# Patient Record
Sex: Male | Born: 1945 | Hispanic: No | Smoking: Current every day smoker
Health system: Southern US, Community
[De-identification: ages and names within clinical notes are randomized; demographics above are authoritative.]

## PROBLEM LIST (undated history)

## (undated) DIAGNOSIS — E785 Hyperlipidemia, unspecified: Secondary | ICD-10-CM

## (undated) DIAGNOSIS — E119 Type 2 diabetes mellitus without complications: Secondary | ICD-10-CM

## (undated) DIAGNOSIS — I5022 Chronic systolic (congestive) heart failure: Secondary | ICD-10-CM

## (undated) DIAGNOSIS — I2510 Atherosclerotic heart disease of native coronary artery without angina pectoris: Secondary | ICD-10-CM

## (undated) DIAGNOSIS — N182 Chronic kidney disease, stage 2 (mild): Secondary | ICD-10-CM

## (undated) DIAGNOSIS — I429 Cardiomyopathy, unspecified: Secondary | ICD-10-CM

## (undated) DIAGNOSIS — Z79899 Other long term (current) drug therapy: Secondary | ICD-10-CM

## (undated) HISTORY — DX: Chronic kidney disease, stage 2 (mild): N18.2

## (undated) HISTORY — DX: Hyperlipidemia, unspecified: E78.5

## (undated) HISTORY — DX: Other long term (current) drug therapy: Z79.899

## (undated) HISTORY — DX: Atherosclerotic heart disease of native coronary artery without angina pectoris: I25.10

## (undated) HISTORY — DX: Chronic systolic (congestive) heart failure: I50.22

## (undated) HISTORY — PX: CARDIAC CATHETERIZATION: SHX172

## (undated) HISTORY — DX: Type 2 diabetes mellitus without complications: E11.9

## (undated) HISTORY — DX: Cardiomyopathy, unspecified: I42.9

---

## 1997-11-20 ENCOUNTER — Other Ambulatory Visit: Admission: RE | Admit: 1997-11-20 | Discharge: 1997-11-20 | Payer: Self-pay | Admitting: *Deleted

## 2015-05-06 DIAGNOSIS — I2510 Atherosclerotic heart disease of native coronary artery without angina pectoris: Secondary | ICD-10-CM

## 2015-05-06 DIAGNOSIS — N189 Chronic kidney disease, unspecified: Secondary | ICD-10-CM | POA: Insufficient documentation

## 2015-05-06 DIAGNOSIS — I5042 Chronic combined systolic (congestive) and diastolic (congestive) heart failure: Secondary | ICD-10-CM | POA: Insufficient documentation

## 2015-05-06 DIAGNOSIS — N182 Chronic kidney disease, stage 2 (mild): Secondary | ICD-10-CM | POA: Insufficient documentation

## 2015-05-06 DIAGNOSIS — E785 Hyperlipidemia, unspecified: Secondary | ICD-10-CM

## 2015-05-06 DIAGNOSIS — I429 Cardiomyopathy, unspecified: Secondary | ICD-10-CM

## 2015-05-06 DIAGNOSIS — I5022 Chronic systolic (congestive) heart failure: Secondary | ICD-10-CM

## 2015-05-06 DIAGNOSIS — Z79899 Other long term (current) drug therapy: Secondary | ICD-10-CM

## 2015-05-06 HISTORY — DX: Chronic systolic (congestive) heart failure: I50.22

## 2015-05-06 HISTORY — DX: Cardiomyopathy, unspecified: I42.9

## 2015-05-06 HISTORY — DX: Chronic combined systolic (congestive) and diastolic (congestive) heart failure: I50.42

## 2015-05-06 HISTORY — DX: Atherosclerotic heart disease of native coronary artery without angina pectoris: I25.10

## 2015-05-06 HISTORY — DX: Other long term (current) drug therapy: Z79.899

## 2015-05-06 HISTORY — DX: Chronic kidney disease, unspecified: N18.9

## 2015-05-06 HISTORY — DX: Chronic kidney disease, stage 2 (mild): N18.2

## 2015-05-06 HISTORY — DX: Hyperlipidemia, unspecified: E78.5

## 2017-03-15 ENCOUNTER — Other Ambulatory Visit: Payer: Self-pay | Admitting: *Deleted

## 2017-03-15 ENCOUNTER — Other Ambulatory Visit: Payer: Self-pay

## 2017-03-15 MED ORDER — FUROSEMIDE 20 MG PO TABS: 20.0000 mg | ORAL_TABLET | Freq: Every day | ORAL | 0 refills | Status: DC

## 2017-03-15 MED ORDER — LISINOPRIL 2.5 MG PO TABS: 2.5000 mg | ORAL_TABLET | Freq: Every day | ORAL | 0 refills | Status: DC

## 2017-03-15 MED ORDER — SPIRONOLACTONE 25 MG PO TABS: 25.0000 mg | ORAL_TABLET | Freq: Every day | ORAL | 0 refills | Status: DC

## 2017-03-15 MED ORDER — POTASSIUM CHLORIDE CRYS ER 20 MEQ PO TBCR: 20.0000 meq | EXTENDED_RELEASE_TABLET | Freq: Every day | ORAL | 0 refills | Status: DC

## 2017-03-17 ENCOUNTER — Encounter: Payer: Self-pay | Admitting: *Deleted

## 2017-03-17 ENCOUNTER — Other Ambulatory Visit: Payer: Self-pay | Admitting: *Deleted

## 2017-03-17 DIAGNOSIS — E119 Type 2 diabetes mellitus without complications: Secondary | ICD-10-CM | POA: Insufficient documentation

## 2017-03-17 HISTORY — DX: Type 2 diabetes mellitus without complications: E11.9

## 2017-05-02 DIAGNOSIS — I110 Hypertensive heart disease with heart failure: Secondary | ICD-10-CM | POA: Insufficient documentation

## 2017-05-02 HISTORY — DX: Hypertensive heart disease with heart failure: I11.0

## 2017-05-02 NOTE — Progress Notes (Signed)
Cardiology Office Note:    Date:  05/03/2017   ID:  Frank Swanson, DOB [birth year], MRN [id number]  PCP:  Patient, No Pcp Per  Cardiologist:  Norman Herrlich, MD    Referring MD: No ref. provider found    ASSESSMENT:    1. Chronic combined systolic and diastolic heart failure (HCC)   2. Mild CAD   3. Hyperlipidemia, unspecified hyperlipidemia type   4. Chronic kidney disease, unspecified CKD stage   5. Hypertensive heart disease with heart failure (HCC)    PLAN:    In order of problems listed above:  1. Stable compensated continue current treatment including loop diuretic MRA ACE inhibitor.  Along with beta-blocker.  Previously has had significant reduction in ejection fraction I will recheck an echocardiogram and if EF less than 40 consider transition to ARNI.  I encouraged him to continue to sodium restrict and self manage his heart failure.  Stable continue medical therapy 2. We discussed the merits of a myocardial perfusion study and he declines at this time.  He is taking aspirin 81 mg daily at home 3. Poorly controlled restart a statin 4. Recheck renal function 5. Stable continue current medical treatment diuretic MRA beta-blocker statin.   Next appointment: 6 months   Medication Adjustments/Labs and Tests Ordered: Current medicines are reviewed at length with the patient today.  Concerns regarding medicines are outlined above.  Orders Placed This Encounter  Procedures  . Comprehensive Metabolic Panel (CMET)  . Lipid Profile  . B Nat Peptide  . EKG 12-Lead   Meds ordered this encounter  Medications  . carvedilol (COREG) 6.25 MG tablet    Sig: Take 1 tablet (6.25 mg total) by mouth 2 (two) times daily.    Dispense:  180 tablet    Refill:  3  . furosemide (LASIX) 20 MG tablet    Sig: Take 1 tablet (20 mg total) by mouth daily.    Dispense:  90 tablet    Refill:  3  . lisinopril (PRINIVIL,ZESTRIL) 2.5 MG tablet    Sig: Take 1 tablet (2.5 mg total) by mouth daily.      Dispense:  90 tablet    Refill:  3  . nitroGLYCERIN (NITROSTAT) 0.4 MG SL tablet    Sig: Place 1 tablet (0.4 mg total) under the tongue every 5 (five) minutes as needed for chest pain.    Dispense:  25 tablet    Refill:  11  . potassium chloride SA (K-DUR,KLOR-CON) 20 MEQ tablet    Sig: Take 1 tablet (20 mEq total) by mouth daily.    Dispense:  90 tablet    Refill:  3  . spironolactone (ALDACTONE) 25 MG tablet    Sig: Take 1 tablet (25 mg total) by mouth daily.    Dispense:  90 tablet    Refill:  3  . pravastatin (PRAVACHOL) 20 MG tablet    Sig: Take 1 tablet (20 mg total) by mouth daily.    Dispense:  90 tablet    Refill:  3    Chief Complaint  Patient presents with  . Follow-up  . Coronary Artery Disease    History of Present Illness:    Frank Swanson is a 71 y.o. male with a hx of CAD,CHF with improved EF 40-45% in January 2017, CKD and Dyslipidemia  last seen one year ago.. Compliance with diet, lifestyle and medications: Yes Overall he is pleased with the quality of his life he has had no chest pain nitroglycerin usage  shortness of breath edema palpitations or syncope.  He is compliant with his medications but in some fashion no longer takes a statin. Past Medical History:  Diagnosis Date  . Cardiomyopathy (HCC) 05/06/2015  . 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 Chronic systolic heart failure (HCC) 05/06/2015  . High risk medication use 05/06/2015   Overview:  Digoxin  . Hyperlipidemia 05/06/2015  . Mild CAD 05/06/2015  . Type 2 diabetes mellitus without complication, without long-term current use of insulin (HCC) 03/17/2017    Past Surgical History:  Procedure Laterality Date  . CARDIAC CATHETERIZATION      Current Medications: Current Meds  Medication Sig  . carvedilol (COREG) 6.25 MG tablet Take 1 tablet (6.25 mg total) by mouth 2 (two) times daily.  . furosemide (LASIX) 20 MG tablet Take 1 tablet (20 mg total) by mouth daily.  Marland Kitchen.  lisinopril (PRINIVIL,ZESTRIL) 2.5 MG tablet Take 1 tablet (2.5 mg total) by mouth daily.  . nitroGLYCERIN (NITROSTAT) 0.4 MG SL tablet Place 1 tablet (0.4 mg total) under the tongue every 5 (five) minutes as needed for chest pain.  Marland Kitchen. omeprazole (PRILOSEC) 20 MG capsule Take 20 mg by mouth daily.  . potassium chloride SA (K-DUR,KLOR-CON) 20 MEQ tablet Take 1 tablet (20 mEq total) by mouth daily.  Marland Kitchen. spironolactone (ALDACTONE) 25 MG tablet Take 1 tablet (25 mg total) by mouth daily.  . [DISCONTINUED] carvedilol (COREG) 6.25 MG tablet Take 6.25 mg by mouth 2 (two) times daily.  . [DISCONTINUED] furosemide (LASIX) 20 MG tablet Take 1 tablet (20 mg total) by mouth daily.  . [DISCONTINUED] lisinopril (PRINIVIL,ZESTRIL) 2.5 MG tablet Take 1 tablet (2.5 mg total) by mouth daily.  . [DISCONTINUED] nitroGLYCERIN (NITROSTAT) 0.4 MG SL tablet Place 0.4 mg under the tongue.  . [DISCONTINUED] potassium chloride SA (K-DUR,KLOR-CON) 20 MEQ tablet Take 1 tablet (20 mEq total) by mouth daily.  . [DISCONTINUED] spironolactone (ALDACTONE) 25 MG tablet Take 1 tablet (25 mg total) by mouth daily.     Allergies:   Patient has no known allergies.   Social History   Socioeconomic History  . Marital status: Unknown    Spouse name: None  . Number of children: None  . Years of education: None  . Highest education level: None  Social Needs  . Financial resource strain: None  . Food insecurity - worry: None  . Food insecurity - inability: None  . Transportation needs - medical: None  . Transportation needs - non-medical: None  Occupational History  . None  Tobacco Use  . Smoking status: Current Every Day Smoker  . Smokeless tobacco: Never Used  Substance and Sexual Activity  . Alcohol use: No  . Drug use: No  . Sexual activity: None  Other Topics Concern  . None  Social History Narrative  . None     Family History: The patient's family history is not on file. ROS:   Please see the history of present  illness.    All other systems reviewed and are negative.  EKGs/Labs/Other Studies Reviewed:    The following studies were reviewed today:  EKG:  EKG ordered today.  The ekg ordered today demonstrates sinus rhythm normal  Recent Labs: No results found for requested labs within last 8760 hours.  Recent Lipid Panel No results found for: CHOL, TRIG, HDL, CHOLHDL, VLDL, LDLCALC, LDLDIRECT  Physical Exam:    VS:  BP 110/60 (BP Location: Right Arm, Patient Position: Sitting, Cuff Size: Normal)   Pulse 88  Ht 5\' 9"  (1.753 m)   Wt 189 lb (85.7 kg)   SpO2 98%   BMI 27.91 kg/m     Wt Readings from Last 3 Encounters:  05/03/17 189 lb (85.7 kg)     GEN:  Well nourished, well developed in no acute distress HEENT: Normal NECK: No JVD; No carotid bruits LYMPHATICS: No lymphadenopathy CARDIAC: RRR, no murmurs, rubs, gallops RESPIRATORY:  Clear to auscultation without rales, wheezing or rhonchi  ABDOMEN: Soft, non-tender, non-distended MUSCULOSKELETAL:  No edema; No deformity  SKIN: Warm and dry NEUROLOGIC:  Alert and oriented x 3 PSYCHIATRIC:  Normal affect    Signed, Norman HerrlichBrian Madden Garron, MD  05/03/2017 12:34 PM    [facility]

## 2017-05-03 ENCOUNTER — Encounter: Payer: Self-pay | Admitting: Cardiology

## 2017-05-03 ENCOUNTER — Ambulatory Visit (INDEPENDENT_AMBULATORY_CARE_PROVIDER_SITE_OTHER): Payer: Medicare Other | Admitting: Cardiology

## 2017-05-03 VITALS — BP 110/60 | HR 88 | Ht 69.0 in | Wt 189.0 lb

## 2017-05-03 DIAGNOSIS — E785 Hyperlipidemia, unspecified: Secondary | ICD-10-CM

## 2017-05-03 DIAGNOSIS — I2510 Atherosclerotic heart disease of native coronary artery without angina pectoris: Secondary | ICD-10-CM

## 2017-05-03 DIAGNOSIS — N189 Chronic kidney disease, unspecified: Secondary | ICD-10-CM

## 2017-05-03 DIAGNOSIS — I110 Hypertensive heart disease with heart failure: Secondary | ICD-10-CM

## 2017-05-03 DIAGNOSIS — I5042 Chronic combined systolic (congestive) and diastolic (congestive) heart failure: Secondary | ICD-10-CM

## 2017-05-03 MED ORDER — SPIRONOLACTONE 25 MG PO TABS: 25.0000 mg | ORAL_TABLET | Freq: Every day | ORAL | 3 refills | Status: DC

## 2017-05-03 MED ORDER — POTASSIUM CHLORIDE CRYS ER 20 MEQ PO TBCR: 20.0000 meq | EXTENDED_RELEASE_TABLET | Freq: Every day | ORAL | 3 refills | Status: DC

## 2017-05-03 MED ORDER — PRAVASTATIN SODIUM 20 MG PO TABS: 20.0000 mg | ORAL_TABLET | Freq: Every day | ORAL | 3 refills | Status: DC

## 2017-05-03 MED ORDER — NITROGLYCERIN 0.4 MG SL SUBL: 0.4000 mg | SUBLINGUAL_TABLET | SUBLINGUAL | 11 refills | Status: DC | PRN

## 2017-05-03 MED ORDER — CARVEDILOL 6.25 MG PO TABS: 6.2500 mg | ORAL_TABLET | Freq: Two times a day (BID) | ORAL | 3 refills | Status: DC

## 2017-05-03 MED ORDER — LISINOPRIL 2.5 MG PO TABS: 2.5000 mg | ORAL_TABLET | Freq: Every day | ORAL | 3 refills | Status: DC

## 2017-05-03 MED ORDER — FUROSEMIDE 20 MG PO TABS: 20.0000 mg | ORAL_TABLET | Freq: Every day | ORAL | 3 refills | Status: DC

## 2017-05-03 NOTE — Patient Instructions (Addendum)
Medication Instructions:  Your physician has recommended you make the following change in your medication:  RESTART pravastatin  Labwork: Your physician recommends that you return for lab work in: today. CMP, BNP, lipid  Testing/Procedures: You had an EKG today.  Follow-Up: Your physician wants you to follow-up in: 7 months. You will receive a reminder letter in the mail two months in advance. If you don't receive a letter, please call our office to schedule the follow-up appointment.  Any Other Special Instructions Will Be Listed Below (If Applicable).     If you need a refill on your cardiac medications before your next appointment, please call your pharmacy.    Heart Failure  Weigh yourself every morning when you first wake up and record on a calender or note pad, bring this to your office visits. Using a pill tender can help with taking your medications consistently.  Limit your fluid intake to 2 liters daily  Limit your sodium intake to less than 2-3 grams daily. Ask if you need dietary teaching.  If you gain more than 3 pounds (from your dry weight ), double your dose of diuretic for the day.  If you gain more than 5 pounds (from your dry weight), double your dose of lasix and call your heart failure doctor.  Please do not smoke tobacco since it is very bad for your heart.  Please do not drink alcohol since it can worsen your heart failure.Also avoid OTC nonsteroidal drugs, such as advil, aleve and motrin.  Try to exercise for at least 30 minutes every day because this will help your heart be more efficient. You may be eligible for supervised cardiac rehab, ask your physician.

## 2017-05-04 LAB — COMPREHENSIVE METABOLIC PANEL
A/G RATIO: 1.8 (ref 1.2–2.2)
ALK PHOS: 71 IU/L (ref 39–117)
ALT: 14 IU/L (ref 0–44)
AST: 17 IU/L (ref 0–40)
Albumin: 4.5 g/dL (ref 3.5–4.8)
BUN/Creatinine Ratio: 12 (ref 10–24)
BUN: 14 mg/dL (ref 8–27)
Bilirubin Total: 0.6 mg/dL (ref 0.0–1.2)
CALCIUM: 9.1 mg/dL (ref 8.6–10.2)
CHLORIDE: 101 mmol/L (ref 96–106)
CO2: 25 mmol/L (ref 20–29)
Creatinine, Ser: 1.16 mg/dL (ref 0.76–1.27)
GFR calc Af Amer: 72 mL/min/{1.73_m2} (ref >59)
GFR, EST NON AFRICAN AMERICAN: 62 mL/min/{1.73_m2} (ref >59)
Globulin, Total: 2.5 g/dL (ref 1.5–4.5)
Glucose: 101 mg/dL — ABNORMAL HIGH (ref 65–99)
POTASSIUM: 4.3 mmol/L (ref 3.5–5.2)
Sodium: 139 mmol/L (ref 134–144)
Total Protein: 7 g/dL (ref 6.0–8.5)

## 2017-05-04 LAB — LIPID PANEL
CHOL/HDL RATIO: 6.6 ratio — AB (ref 0.0–5.0)
CHOLESTEROL TOTAL: 225 mg/dL — AB (ref 100–199)
HDL: 34 mg/dL — AB (ref >39)
LDL Calculated: 127 mg/dL — ABNORMAL HIGH (ref 0–99)
TRIGLYCERIDES: 320 mg/dL — AB (ref 0–149)
VLDL Cholesterol Cal: 64 mg/dL — ABNORMAL HIGH (ref 5–40)

## 2017-05-04 LAB — BRAIN NATRIURETIC PEPTIDE: BNP: 42 pg/mL (ref 0.0–100.0)

## 2017-09-19 ENCOUNTER — Other Ambulatory Visit: Payer: Self-pay

## 2017-09-19 MED ORDER — CARVEDILOL 6.25 MG PO TABS: 6.2500 mg | ORAL_TABLET | Freq: Two times a day (BID) | ORAL | 1 refills | Status: DC

## 2018-06-12 ENCOUNTER — Other Ambulatory Visit: Payer: Self-pay | Admitting: Cardiology

## 2018-06-12 NOTE — Telephone Encounter (Signed)
Patient needs a refill  Nitorglycerin 0.4mg  and he is scheduled for app tin February with B Munley.  Please call to the [facility] In [place]

## 2018-06-13 MED ORDER — NITROGLYCERIN 0.4 MG SL SUBL: 0.4000 mg | SUBLINGUAL_TABLET | SUBLINGUAL | 0 refills | Status: DC | PRN

## 2018-06-13 NOTE — Telephone Encounter (Signed)
30 day supply of Nitro sent to pharmacy as requested. Refills will not be authorized until patient is seen for F/U on 07/05/2018.

## 2018-07-04 NOTE — Progress Notes (Signed)
Cardiology Office Note:    Date:  07/05/2018   ID:  Frank Swanson, DOB 02-16-1946, MRN [id number]  PCP:  Patient, No Pcp Per  Cardiologist:  Norman Herrlich, MD    Referring MD: No ref. provider found    ASSESSMENT:    1. Mild CAD   2. Chronic combined systolic and diastolic heart failure (HCC)   3. Hypertensive heart disease with heart failure (HCC)   4. Chronic kidney disease, unspecified CKD stage   5. Hyperlipidemia, unspecified hyperlipidemia type    PLAN:    In order of problems listed above:  1. Stable CAD continue medical treatment of a has recurrent angina he did need either cardiac CTA or coronary angiography. 2. Stable compensated no fluid overload continue current treatment including his loop diuretic beta-blocker ACE and distal diuretic 3. Stable continue current treatment with cardiomyopathy 4. Recheck renal function 5. Continue his low intensity statin he is poorly tolerant to the other products   Next appointment: 1 year   Medication Adjustments/Labs and Tests Ordered: Current medicines are reviewed at length with the patient today.  Concerns regarding medicines are outlined above.  No orders of the defined types were placed in this encounter.  No orders of the defined types were placed in this encounter.   Chief Complaint  Patient presents with  . Follow-up    for   . Coronary Artery Disease  . Congestive Heart Failure  . Diabetes Mellitus  . Chronic Kidney Disease    History of Present Illness:    Frank Swanson is a 73 y.o. male with a hx of CAD,CHF with improved EF 40-45% in January 2017, CKD and Dyslipidemia  Intolerant of high intensity statin last seen by me 05/03/17. Compliance with diet, lifestyle and medications: Yes  He is pleased with the quality of his life he has had no symptoms of shortness of breath edema chest pain palpitation or syncope we discussed the potential of doing an ischemic evaluation or echocardiogram for ejection fraction  and he feels at this time he prefers not to do it.  We will recheck his labs as he does not have primary care at this time. Past Medical History:  Diagnosis Date  . Cardiomyopathy (HCC) 05/06/2015  . 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 Chronic systolic heart failure (HCC) 05/06/2015  . High risk medication use 05/06/2015   Overview:  Digoxin  . Hyperlipidemia 05/06/2015  . Mild CAD 05/06/2015  . Type 2 diabetes mellitus without complication, without long-term current use of insulin (HCC) 03/17/2017    Past Surgical History:  Procedure Laterality Date  . CARDIAC CATHETERIZATION      Current Medications: Current Meds  Medication Sig  . carvedilol (COREG) 6.25 MG tablet Take 1 tablet (6.25 mg total) by mouth 2 (two) times daily.  . furosemide (LASIX) 20 MG tablet Take 1 tablet (20 mg total) by mouth daily.  Marland Kitchen lisinopril (PRINIVIL,ZESTRIL) 2.5 MG tablet Take 1 tablet (2.5 mg total) by mouth daily.  . nitroGLYCERIN (NITROSTAT) 0.4 MG SL tablet Place 1 tablet (0.4 mg total) under the tongue every 5 (five) minutes as needed for chest pain.  Marland Kitchen omeprazole (PRILOSEC) 20 MG capsule Take 20 mg by mouth daily.  . potassium chloride SA (K-DUR,KLOR-CON) 20 MEQ tablet Take 1 tablet (20 mEq total) by mouth daily.  . pravastatin (PRAVACHOL) 20 MG tablet Take 1 tablet (20 mg total) by mouth daily.  Marland Kitchen spironolactone (ALDACTONE) 25 MG tablet Take 1 tablet (25 mg total)  by mouth daily.     Allergies:   Patient has no known allergies.   Social History   Socioeconomic History  . Marital status: Unknown    Spouse name: Not on file  . Number of children: Not on file  . Years of education: Not on file  . Highest education level: Not on file  Occupational History  . Not on file  Social Needs  . Financial resource strain: Not on file  . Food insecurity:    Worry: Not on file    Inability: Not on file  . Transportation needs:    Medical: Not on file    Non-medical: Not on file    Tobacco Use  . Smoking status: Current Every Day Smoker    Types: Cigarettes  . Smokeless tobacco: Never Used  Substance and Sexual Activity  . Alcohol use: No  . Drug use: No  . Sexual activity: Not on file  Lifestyle  . Physical activity:    Days per week: Not on file    Minutes per session: Not on file  . Stress: Not on file  Relationships  . Social connections:    Talks on phone: Not on file    Gets together: Not on file    Attends religious service: Not on file    Active member of club or organization: Not on file    Attends meetings of clubs or organizations: Not on file    Relationship status: Not on file  Other Topics Concern  . Not on file  Social History Narrative  . Not on file     Family History: The patient's family history includes Cancer in his father; Heart failure in his mother. ROS:   Please see the history of present illness.    All other systems reviewed and are negative.  EKGs/Labs/Other Studies Reviewed:    The following studies were reviewed today:  EKG:  EKG ordered today.  The ekg ordered today demonstrates shows sinus rhythm nonspecific ST-T abnormality 2 PVCs  Recent Labs: No results found for requested labs within last 8760 hours.  Recent Lipid Panel    Component Value Date/Time   CHOL 225 (H) 05/03/2017 1006   TRIG 320 (H) 05/03/2017 1006   HDL 34 (L) 05/03/2017 1006   CHOLHDL 6.6 (H) 05/03/2017 1006   LDLCALC 127 (H) 05/03/2017 1006    Physical Exam:    VS:  BP 100/68 (BP Location: Right Arm, Patient Position: Sitting, Cuff Size: Normal)   Pulse 79   Ht 5\' 9"  (1.753 m)   Wt 188 lb 3.2 oz (85.4 kg)   SpO2 96%   BMI 27.79 kg/m     Wt Readings from Last 3 Encounters:  07/05/18 188 lb 3.2 oz (85.4 kg)  05/03/17 189 lb (85.7 kg)     GEN:  Well nourished, well developed in no acute distress HEENT: Normal NECK: No JVD; No carotid bruits LYMPHATICS: No lymphadenopathy CARDIAC: RRR, no murmurs, rubs, gallops RESPIRATORY:   Clear to auscultation without rales, wheezing or rhonchi  ABDOMEN: Soft, non-tender, non-distended MUSCULOSKELETAL:  No edema; No deformity  SKIN: Warm and dry NEUROLOGIC:  Alert and oriented x 3 PSYCHIATRIC:  Normal affect    Signed, Norman Herrlich, MD  07/05/2018 9:05 AM    [facility]

## 2018-07-05 ENCOUNTER — Ambulatory Visit (INDEPENDENT_AMBULATORY_CARE_PROVIDER_SITE_OTHER): Payer: Medicare Other | Admitting: Cardiology

## 2018-07-05 ENCOUNTER — Encounter: Payer: Self-pay | Admitting: Cardiology

## 2018-07-05 VITALS — BP 100/68 | HR 79 | Ht 69.0 in | Wt 188.2 lb

## 2018-07-05 DIAGNOSIS — I2510 Atherosclerotic heart disease of native coronary artery without angina pectoris: Secondary | ICD-10-CM | POA: Diagnosis not present

## 2018-07-05 DIAGNOSIS — I5042 Chronic combined systolic (congestive) and diastolic (congestive) heart failure: Secondary | ICD-10-CM | POA: Diagnosis not present

## 2018-07-05 DIAGNOSIS — E785 Hyperlipidemia, unspecified: Secondary | ICD-10-CM

## 2018-07-05 DIAGNOSIS — N189 Chronic kidney disease, unspecified: Secondary | ICD-10-CM | POA: Diagnosis not present

## 2018-07-05 DIAGNOSIS — I110 Hypertensive heart disease with heart failure: Secondary | ICD-10-CM | POA: Diagnosis not present

## 2018-07-05 DIAGNOSIS — Z1329 Encounter for screening for other suspected endocrine disorder: Secondary | ICD-10-CM

## 2018-07-05 LAB — COMPREHENSIVE METABOLIC PANEL
ALT: 15 IU/L (ref 0–44)
AST: 19 IU/L (ref 0–40)
Albumin/Globulin Ratio: 2 (ref 1.2–2.2)
Albumin: 4.5 g/dL (ref 3.7–4.7)
Alkaline Phosphatase: 68 IU/L (ref 39–117)
BILIRUBIN TOTAL: 0.6 mg/dL (ref 0.0–1.2)
BUN / CREAT RATIO: 15 (ref 10–24)
BUN: 18 mg/dL (ref 8–27)
CALCIUM: 9.2 mg/dL (ref 8.6–10.2)
CO2: 22 mmol/L (ref 20–29)
Chloride: 101 mmol/L (ref 96–106)
Creatinine, Ser: 1.22 mg/dL (ref 0.76–1.27)
GFR, EST AFRICAN AMERICAN: 67 mL/min/{1.73_m2} (ref >59)
GFR, EST NON AFRICAN AMERICAN: 58 mL/min/{1.73_m2} — AB (ref >59)
GLOBULIN, TOTAL: 2.2 g/dL (ref 1.5–4.5)
Glucose: 120 mg/dL — ABNORMAL HIGH (ref 65–99)
POTASSIUM: 4.1 mmol/L (ref 3.5–5.2)
SODIUM: 140 mmol/L (ref 134–144)
TOTAL PROTEIN: 6.7 g/dL (ref 6.0–8.5)

## 2018-07-05 LAB — TSH: TSH: 0.714 u[IU]/mL (ref 0.450–4.500)

## 2018-07-05 LAB — PRO B NATRIURETIC PEPTIDE: NT-Pro BNP: 1637 pg/mL — ABNORMAL HIGH (ref 0–376)

## 2018-07-05 LAB — LIPID PANEL
Chol/HDL Ratio: 5.2 ratio — ABNORMAL HIGH (ref 0.0–5.0)
Cholesterol, Total: 196 mg/dL (ref 100–199)
HDL: 38 mg/dL — ABNORMAL LOW (ref >39)
LDL CALC: 107 mg/dL — AB (ref 0–99)
Triglycerides: 257 mg/dL — ABNORMAL HIGH (ref 0–149)
VLDL CHOLESTEROL CAL: 51 mg/dL — AB (ref 5–40)

## 2018-07-05 MED ORDER — NITROGLYCERIN 0.4 MG SL SUBL: 0.4000 mg | SUBLINGUAL_TABLET | SUBLINGUAL | 11 refills | Status: DC | PRN

## 2018-07-05 MED ORDER — ASPIRIN EC 81 MG PO TBEC: 81.0000 mg | DELAYED_RELEASE_TABLET | Freq: Every day | ORAL | 3 refills | Status: AC

## 2018-07-05 NOTE — Patient Instructions (Addendum)
Medication Instructions:  Your physician recommends that you continue on your current medications as directed. Please refer to the Current Medication list given to you today.RN asked patient if he was currently taking a 81 mg aspirin, if not Dr. Dulce Sellar request he start a 81mg  Aspirin daily.  If you need a refill on your cardiac medications before your next appointment, please call your pharmacy.   Lab work: Your physician recommends that you have a lipid panel, CMP, TSH and ProBNP today.   If you have labs (blood work) drawn today and your tests are completely normal, you will receive your results only by: Marland Kitchen MyChart Message (if you have MyChart) OR . A paper copy in the mail If you have any lab test that is abnormal or we need to change your treatment, we will call you to review the results.  Testing/Procedures: An EKG was performed today  Follow-Up: At [facility], you and your health needs are our priority.  As part of our continuing mission to provide you with exceptional heart care, we have created designated Provider Care Teams.  These Care Teams include your primary Cardiologist (physician) and Advanced Practice Providers (APPs -  Physician Assistants and Nurse Practitioners) who all work together to provide you with the care you need, when you need it. You will need a follow up appointment in 1 years.  Please call our office 2 months in advance to schedule this appointment.

## 2018-09-23 ENCOUNTER — Other Ambulatory Visit: Payer: Self-pay | Admitting: Cardiology

## 2018-09-24 NOTE — Telephone Encounter (Signed)
Rx refill sent to pharmacy. 

## 2019-02-03 ENCOUNTER — Other Ambulatory Visit: Payer: Self-pay | Admitting: Cardiology

## 2019-02-04 ENCOUNTER — Other Ambulatory Visit: Payer: Self-pay

## 2019-02-04 MED ORDER — CARVEDILOL 6.25 MG PO TABS: 6.2500 mg | ORAL_TABLET | Freq: Two times a day (BID) | ORAL | 1 refills | Status: DC

## 2019-04-17 ENCOUNTER — Other Ambulatory Visit: Payer: Self-pay | Admitting: Cardiology

## 2019-05-26 ENCOUNTER — Other Ambulatory Visit: Payer: Self-pay | Admitting: Cardiology

## 2019-06-10 ENCOUNTER — Other Ambulatory Visit: Payer: Self-pay | Admitting: Cardiology

## 2019-07-04 ENCOUNTER — Telehealth: Payer: Self-pay | Admitting: Cardiology

## 2019-07-04 MED ORDER — NITROGLYCERIN 0.4 MG SL SUBL: SUBLINGUAL_TABLET | SUBLINGUAL | 0 refills | Status: DC

## 2019-07-04 NOTE — Addendum Note (Signed)
Addended by: Theola Sequin on: 07/04/2019 04:33 PM   Modules accepted: Orders

## 2019-07-04 NOTE — Telephone Encounter (Signed)
Call nitrostat to [facility] in [place]

## 2019-07-04 NOTE — Telephone Encounter (Signed)
RX for #[id number] sent to [facility] pharmacy on file per patient request. He has an appointment scheduled for next month with provider.

## 2019-07-05 ENCOUNTER — Ambulatory Visit: Payer: Medicare Other | Admitting: Cardiology

## 2019-07-22 ENCOUNTER — Other Ambulatory Visit: Payer: Self-pay | Admitting: Cardiology

## 2019-08-06 NOTE — Progress Notes (Signed)
Cardiology Office Note:    Date:  08/07/2019   ID:  Frank Swanson, DOB 08-30-1945, MRN [id number]  PCP:  Patient, No Pcp Per  Cardiologist:  Shirlee More, MD    Referring MD: No ref. provider found    ASSESSMENT:    1. Mild CAD   2. Chronic combined systolic and diastolic heart failure ([place])   3. Hypertensive heart disease with heart failure ([place])   4. Chronic kidney disease, unspecified CKD stage   5. Hyperlipidemia, unspecified hyperlipidemia type    PLAN:    In order of problems listed above:  1. CAD is stable he has a pattern of exertional angina New York Heart Association class II stable declines additional antianginal therapy or evaluation for revascularization continue his current treatment aspirin beta-blocker and a statin.  If he is having more frequent unstable angina direct referral to coronary angiography is appropriate 2. His heart failure is compensated New York Heart Association class I has no edema continue his treatment including small dose of loop diuretic distal diuretic and beta-blocker.  He also takes a small dose of lisinopril.  If he had a great an echocardiogram and if EF is less than 40 I will transition him to contrast. 3. BP stable at target continue guideline directed therapy 4. Recheck renal function potassium with spironolactone 5. Continue with statin he did not tolerate higher intensity statins.  His LDL is greater than 100 I will advise him to take Zetia along with pravastatin   Next appointment: 6 months in order to watch his anginal pattern closely   Medication Adjustments/Labs and Tests Ordered: Current medicines are reviewed at length with the patient today.  Concerns regarding medicines are outlined above.  No orders of the defined types were placed in this encounter.  No orders of the defined types were placed in this encounter.   Chief Complaint  Patient presents with  . Follow-up  . Congestive Heart Failure  . Coronary Artery Disease     History of Present Illness:    Frank Swanson is a 74 y.o. male with a hx of CAD,CHF with improved EF 40-45% in January 2017, CKD and Dyslipidemia  Intolerant of high intensity statin last seen 07/05/2018. Compliance with diet, lifestyle and medications: Yes Follows heart failure is compensated with proBNP level remains elevated.   Ref Range & Units 1 yr ago  NT-Pro BNP 0 - 376 pg/mL 1,637   Avion is pleased with the quality of his life couple times a week he has exertional chest pain relieved with rest and nitroglycerin stable pattern we talked about additional antianginal therapy or ischemia evaluation looking for revascularization and he declines at this time.  He has good healthcare literacy and said he contact me if he has change in his pattern.  He tolerates lipid-lowering therapy without muscle pain or weakness.  He is not having edema shortness of breath orthopnea chest pain palpitation or syncope.  He declines an echocardiogram for ejection fraction.  He prefers medical therapy and will contact me again if there is a change in CAD or heart failure status Past Medical History:  Diagnosis Date  . Cardiomyopathy ([place]) 05/06/2015  . Chronic combined systolic and diastolic heart failure ([place]) 05/06/2015  . 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 Chronic systolic heart failure ([place]) 05/06/2015  . CKD (chronic kidney disease) 05/06/2015  . High risk medication use 05/06/2015   Overview:  Digoxin  . Hyperlipidemia 05/06/2015  . Hypertensive heart disease with heart  failure (HCC) 05/02/2017  . Mild CAD 05/06/2015  . Type 2 diabetes mellitus without complication, without long-term current use of insulin (HCC) 03/17/2017    Past Surgical History:  Procedure Laterality Date  . CARDIAC CATHETERIZATION      Current Medications: Current Meds  Medication Sig  . aspirin EC 81 MG tablet Take 1 tablet (81 mg total) by mouth daily.  . carvedilol (COREG) 6.25 MG tablet Take 1  tablet (6.25 mg total) by mouth 2 (two) times daily.  . furosemide (LASIX) 20 MG tablet Take 1 tablet by mouth once daily  . lisinopril (ZESTRIL) 2.5 MG tablet Take 1 tablet by mouth once daily  . nitroGLYCERIN (NITROSTAT) 0.4 MG SL tablet DISSOLVE ONE TABLET UNDER THE TONGUE EVERY 5 MINUTES AS NEEDED FOR CHEST PAIN.  DO NOT EXCEED A TOTAL OF 3 DOSES IN 15 MINUTES  . omeprazole (PRILOSEC) 20 MG capsule Take 20 mg by mouth daily.  . potassium chloride SA (K-DUR) 20 MEQ tablet Take 1 tablet by mouth once daily  . pravastatin (PRAVACHOL) 20 MG tablet Take 1 tablet by mouth once daily  . spironolactone (ALDACTONE) 25 MG tablet Take 1 tablet by mouth once daily     Allergies:   Patient has no known allergies.   Social History   Socioeconomic History  . Marital status: Unknown    Spouse name: Not on file  . Number of children: Not on file  . Years of education: Not on file  . Highest education level: Not on file  Occupational History  . Not on file  Tobacco Use  . Smoking status: Current Every Day Smoker    Types: Cigarettes  . Smokeless tobacco: Never Used  Substance and Sexual Activity  . Alcohol use: No  . Drug use: No  . Sexual activity: Not on file  Other Topics Concern  . Not on file  Social History Narrative  . Not on file   Social Determinants of Health   Financial Resource Strain:   . Difficulty of Paying Living Expenses:   Food Insecurity:   . Worried About Programme researcher, broadcasting/film/video in the Last Year:   . Barista in the Last Year:   Transportation Needs:   . Freight forwarder (Medical):   Marland Kitchen Lack of Transportation (Non-Medical):   Physical Activity:   . Days of Exercise per Week:   . Minutes of Exercise per Session:   Stress:   . Feeling of Stress :   Social Connections:   . Frequency of Communication with Friends and Family:   . Frequency of Social Gatherings with Friends and Family:   . Attends Religious Services:   . Active Member of Clubs or  Organizations:   . Attends Banker Meetings:   Marland Kitchen Marital Status:      Family History: The patient's family history includes Cancer in his father; Heart failure in his mother. ROS:   Please see the history of present illness.    All other systems reviewed and are negative.  EKGs/Labs/Other Studies Reviewed:    The following studies were reviewed today:  EKG:  EKG ordered today and personally reviewed.  The ekg ordered today demonstrates rhythm he has repolarization changes diffusely versus ischemia.  His EKG is improved from a year ago he has no PVCs  Recent Labs: No lab work since his last visit Recent Lipid Panel    Component Value Date/Time   CHOL 196 07/05/2018 0918   TRIG 257 (  H) 07/05/2018 0918   HDL 38 (L) 07/05/2018 0918   CHOLHDL 5.2 (H) 07/05/2018 0918   LDLCALC 107 (H) 07/05/2018 2585    Physical Exam:    VS:  BP 136/74   Pulse 74   Temp (!) 97.5 F (36.4 C)   Ht 5\' 9"  (1.753 m)   Wt 180 lb 9.6 oz (81.9 kg)   SpO2 95%   BMI 26.67 kg/m     Wt Readings from Last 3 Encounters:  08/07/19 180 lb 9.6 oz (81.9 kg)  07/05/18 188 lb 3.2 oz (85.4 kg)  05/03/17 189 lb (85.7 kg)     GEN:  Well nourished, well developed in no acute distress HEENT: Normal NECK: No JVD; No carotid bruits LYMPHATICS: No lymphadenopathy CARDIAC: RRR, no murmurs, rubs, gallops RESPIRATORY:  Clear to auscultation without rales, wheezing or rhonchi  ABDOMEN: Soft, non-tender, non-distended MUSCULOSKELETAL:  No edema; No deformity  SKIN: Warm and dry NEUROLOGIC:  Alert and oriented x 3 PSYCHIATRIC:  Normal affect    Signed, 05/05/17, MD  08/07/2019 3:58 PM    [facility]

## 2019-08-07 ENCOUNTER — Ambulatory Visit: Payer: Medicare Other | Admitting: Cardiology

## 2019-08-07 ENCOUNTER — Other Ambulatory Visit: Payer: Self-pay

## 2019-08-07 ENCOUNTER — Encounter (INDEPENDENT_AMBULATORY_CARE_PROVIDER_SITE_OTHER): Payer: Self-pay

## 2019-08-07 VITALS — BP 136/74 | HR 74 | Temp 97.5°F | Ht 69.0 in | Wt 180.6 lb

## 2019-08-07 DIAGNOSIS — E785 Hyperlipidemia, unspecified: Secondary | ICD-10-CM

## 2019-08-07 DIAGNOSIS — I5042 Chronic combined systolic (congestive) and diastolic (congestive) heart failure: Secondary | ICD-10-CM

## 2019-08-07 DIAGNOSIS — I2510 Atherosclerotic heart disease of native coronary artery without angina pectoris: Secondary | ICD-10-CM | POA: Diagnosis not present

## 2019-08-07 DIAGNOSIS — N189 Chronic kidney disease, unspecified: Secondary | ICD-10-CM

## 2019-08-07 DIAGNOSIS — I110 Hypertensive heart disease with heart failure: Secondary | ICD-10-CM | POA: Diagnosis not present

## 2019-08-07 NOTE — Patient Instructions (Signed)
Medication Instructions:  Your physician recommends that you continue on your current medications as directed. Please refer to the Current Medication list given to you today.  *If you need a refill on your cardiac medications before your next appointment, please call your pharmacy*   Lab Work: Cmp, Lipids & Pro Bnp   If you have labs (blood work) drawn today and your tests are completely normal, you will receive your results only by: Marland Kitchen MyChart Message (if you have MyChart) OR . A paper copy in the mail If you have any lab test that is abnormal or we need to change your treatment, we will call you to review the results.   Testing/Procedures: None ordered    Follow-Up: At [facility], you and your health needs are our priority.  As part of our continuing mission to provide you with exceptional heart care, we have created designated Provider Care Teams.  These Care Teams include your primary Cardiologist (physician) and Advanced Practice Providers (APPs -  Physician Assistants and Nurse Practitioners) who all work together to provide you with the care you need, when you need it.  We recommend signing up for the patient portal called "MyChart".  Sign up information is provided on this After Visit Summary.  MyChart is used to connect with patients for Virtual Visits (Telemedicine).  Patients are able to view lab/test results, encounter notes, upcoming appointments, etc.  Non-urgent messages can be sent to your provider as well.   To learn more about what you can do with MyChart, go to [email or web address].    Your next appointment:   6 month(s)  The format for your next appointment:   In Person  Provider:   Norman Herrlich, MD   Other Instructions None

## 2019-08-08 LAB — LIPID PANEL
Chol/HDL Ratio: 6 ratio — ABNORMAL HIGH (ref 0.0–5.0)
Cholesterol, Total: 209 mg/dL — ABNORMAL HIGH (ref 100–199)
HDL: 35 mg/dL — ABNORMAL LOW (ref >39)
LDL Chol Calc (NIH): 118 mg/dL — ABNORMAL HIGH (ref 0–99)
Triglycerides: 319 mg/dL — ABNORMAL HIGH (ref 0–149)
VLDL Cholesterol Cal: 56 mg/dL — ABNORMAL HIGH (ref 5–40)

## 2019-08-08 LAB — COMPREHENSIVE METABOLIC PANEL
ALT: 15 IU/L (ref 0–44)
AST: 19 IU/L (ref 0–40)
Albumin/Globulin Ratio: 1.8 (ref 1.2–2.2)
Albumin: 4.3 g/dL (ref 3.7–4.7)
Alkaline Phosphatase: 74 IU/L (ref 39–117)
BUN/Creatinine Ratio: 16 (ref 10–24)
BUN: 31 mg/dL — ABNORMAL HIGH (ref 8–27)
Bilirubin Total: 0.3 mg/dL (ref 0.0–1.2)
CO2: 21 mmol/L (ref 20–29)
Calcium: 9.2 mg/dL (ref 8.6–10.2)
Chloride: 103 mmol/L (ref 96–106)
Creatinine, Ser: 1.93 mg/dL — ABNORMAL HIGH (ref 0.76–1.27)
GFR calc Af Amer: 38 mL/min/{1.73_m2} — ABNORMAL LOW (ref >59)
GFR calc non Af Amer: 33 mL/min/{1.73_m2} — ABNORMAL LOW (ref >59)
Globulin, Total: 2.4 g/dL (ref 1.5–4.5)
Glucose: 97 mg/dL (ref 65–99)
Potassium: 5 mmol/L (ref 3.5–5.2)
Sodium: 139 mmol/L (ref 134–144)
Total Protein: 6.7 g/dL (ref 6.0–8.5)

## 2019-08-08 LAB — PRO B NATRIURETIC PEPTIDE: NT-Pro BNP: 3013 pg/mL — ABNORMAL HIGH (ref 0–486)

## 2019-08-24 ENCOUNTER — Other Ambulatory Visit: Payer: Self-pay | Admitting: Cardiology

## 2019-10-01 ENCOUNTER — Telehealth: Payer: Self-pay | Admitting: Cardiology

## 2019-10-01 DIAGNOSIS — I5042 Chronic combined systolic (congestive) and diastolic (congestive) heart failure: Secondary | ICD-10-CM

## 2019-10-01 DIAGNOSIS — I429 Cardiomyopathy, unspecified: Secondary | ICD-10-CM

## 2019-10-01 NOTE — Telephone Encounter (Signed)
States he never got his lab results from 03/24

## 2019-10-01 NOTE — Telephone Encounter (Signed)
Spoke to the patient just now and let him know these recommendations from Dr. Dulce Sellar. He verbalizes understanding and does not have any other issues or concerns at this time.   Encouraged patient to call back with any questions or concerns.

## 2019-10-01 NOTE — Telephone Encounter (Signed)
Patient called in stating that he never heard anything in regards to his lab work from 08/07/19. I do not see that it was reviewed. Please review and I will call him back with the results once you have.

## 2019-10-01 NOTE — Addendum Note (Signed)
Addended by: Delorse Limber I on: 10/01/2019 01:36 PM   Modules accepted: Orders

## 2019-10-01 NOTE — Telephone Encounter (Signed)
Thank You for Calling and Not Sure of Ever Seeing These Labs.  His kidney function is worsened compared to baseline  I would like him to stop spironolactone wait 2 weeks and recheck a BMP.

## 2019-10-25 ENCOUNTER — Encounter: Payer: Self-pay | Admitting: Sports Medicine

## 2019-10-25 ENCOUNTER — Other Ambulatory Visit: Payer: Self-pay

## 2019-10-25 ENCOUNTER — Ambulatory Visit: Payer: Medicare Other | Admitting: Sports Medicine

## 2019-10-25 ENCOUNTER — Ambulatory Visit (INDEPENDENT_AMBULATORY_CARE_PROVIDER_SITE_OTHER): Payer: Medicare Other

## 2019-10-25 DIAGNOSIS — M79672 Pain in left foot: Secondary | ICD-10-CM | POA: Diagnosis not present

## 2019-10-25 DIAGNOSIS — M1000 Idiopathic gout, unspecified site: Secondary | ICD-10-CM

## 2019-10-25 DIAGNOSIS — M779 Enthesopathy, unspecified: Secondary | ICD-10-CM

## 2019-10-25 DIAGNOSIS — M7752 Other enthesopathy of left foot: Secondary | ICD-10-CM | POA: Diagnosis not present

## 2019-10-25 DIAGNOSIS — I739 Peripheral vascular disease, unspecified: Secondary | ICD-10-CM

## 2019-10-25 DIAGNOSIS — M10072 Idiopathic gout, left ankle and foot: Secondary | ICD-10-CM

## 2019-10-25 MED ORDER — TRIAMCINOLONE ACETONIDE 10 MG/ML IJ SUSP
10.0000 mg | Freq: Once | INTRAMUSCULAR | Status: AC
  Administered 2019-10-25: 10 mg

## 2019-10-25 NOTE — Patient Instructions (Signed)

## 2019-10-25 NOTE — Progress Notes (Signed)
Subjective: Frank Swanson is a 74 y.o. male patient who presents to office for evaluation of left foot pain. Patient complains of pain at toe joint second toe joint for the last 10 days sharp constant pain relief with elevation worse with walking and standing reports that it has gotten red swollen color change to the area has tried rest patient of Aspercreme without relief. Patient denies any other pedal complaints.   Review of Systems  All other systems reviewed and are negative.    Patient Active Problem List   Diagnosis Date Noted  . Hypertensive heart disease with heart failure (HCC) 05/02/2017  . Type 2 diabetes mellitus without complication, without long-term current use of insulin (HCC) 03/17/2017  . Cardiomyopathy (HCC) 05/06/2015  . CKD (chronic kidney disease) 05/06/2015  . Chronic combined systolic and diastolic heart failure (HCC) 05/06/2015  . High risk medication use 05/06/2015  . Hyperlipidemia 05/06/2015  . Mild CAD 05/06/2015    Current Outpatient Medications on File Prior to Visit  Medication Sig Dispense Refill  . aspirin EC 81 MG tablet Take 1 tablet (81 mg total) by mouth daily. 90 tablet 3  . carvedilol (COREG) 6.25 MG tablet Take 1 tablet (6.25 mg total) by mouth 2 (two) times daily. 180 tablet 1  . furosemide (LASIX) 20 MG tablet Take 1 tablet by mouth once daily 90 tablet 3  . lisinopril (ZESTRIL) 2.5 MG tablet Take 1 tablet by mouth once daily 90 tablet 3  . nitroGLYCERIN (NITROSTAT) 0.4 MG SL tablet DISSOLVE ONE TABLET UNDER THE TONGUE EVERY 5 MINUTES AS NEEDED FOR CHEST PAIN.  DO NOT EXCEED A TOTAL OF 3 DOSES IN 15 MINUTES 25 tablet 3  . omeprazole (PRILOSEC) 20 MG capsule Take 20 mg by mouth daily.    . potassium chloride SA (K-DUR) 20 MEQ tablet Take 1 tablet by mouth once daily 90 tablet 3  . pravastatin (PRAVACHOL) 20 MG tablet Take 1 tablet by mouth once daily 90 tablet 1  . spironolactone (ALDACTONE) 25 MG tablet Take 1 tablet by mouth once daily 90  tablet 3   No current facility-administered medications on file prior to visit.    No Known Allergies  Objective:  General: Alert and oriented x3 in no acute distress  Dermatology: Focal Swelling, warmth, redness present on the left foot at 1st mTPJ>2nd, No open lesions bilateral lower extremities, no webspace macerations, no ecchymosis bilateral, all nails x 10 are well manicured.  Vascular: Dorsalis Pedis and Posterior Tibial pedal pulses 1/4, Capillary Fill Time 5 seconds,(-) pedal hair growth bilateral,Temperature gradient increased over the left first metatarsophalangeal joint mildly.  Neurology: Michaell Cowing sensation intact via light touch bilateral.  Musculoskeletal: There is tenderness with palpation at left first metatarsophalangeal joint,No pain with calf compression bilateral. All joint range of motion is within normal limits except at the left first greater than second metatarsophalangeal joint where there is pain and limiation, Strength within normal limits in all groups bilateral.   Gait: Unassisted, Antalgic gait avoiding weight on left foot.  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       Assessment and Plan: Problem List Items Addressed This Visit    None    Visit Diagnoses    Left foot pain    -  Primary   Relevant Medications   triamcinolone acetonide (KENALOG) 10 MG/ML injection 10 mg (Completed) (Start on  10/25/2019 12:15 PM)   Other Relevant Orders   DG Foot Complete Left   Capsulitis       Relevant Medications   triamcinolone acetonide (KENALOG) 10 MG/ML injection 10 mg (Completed) (Start on 10/25/2019 12:15 PM)   Acute idiopathic gout, unspecified site       Relevant Medications   triamcinolone acetonide (KENALOG) 10 MG/ML injection 10 mg (Completed) (Start on 10/25/2019 12:15 PM)   PVD (peripheral vascular disease) ([place])           -Complete examination performed -Xrays reviewed -Discussed treatement options for capsulitis versus gouty arthritis and gout education provided - After oral consent, injected left first metatarsophalangeal joint with 1cc lidocaine and marcaine plain mixed with 0.25cc Kenalog-10 and Dexmethasone phosphate without complication; patient had a mild bit of pain during the injection but was able to tolerate it.  Post injection care explained. -Advised Tylenol as needed for any postinjection pain -Advised icing and elevating to minimize any postinjection pain -Dispensed Surgitube compression sleeve to wear during the day to assist with edema control -Patient to return in 2-3 weeks for re-check/further discussion for long term management of gout or sooner if condition worsens.  Landis Martins, DPM

## 2019-10-31 ENCOUNTER — Other Ambulatory Visit: Payer: Self-pay | Admitting: Sports Medicine

## 2019-10-31 ENCOUNTER — Other Ambulatory Visit: Payer: Self-pay | Admitting: Cardiology

## 2019-10-31 DIAGNOSIS — M779 Enthesopathy, unspecified: Secondary | ICD-10-CM

## 2019-11-15 ENCOUNTER — Ambulatory Visit: Payer: Medicare Other | Admitting: Sports Medicine

## 2019-11-15 ENCOUNTER — Other Ambulatory Visit: Payer: Self-pay

## 2019-11-15 ENCOUNTER — Encounter: Payer: Self-pay | Admitting: Sports Medicine

## 2019-11-15 DIAGNOSIS — M1000 Idiopathic gout, unspecified site: Secondary | ICD-10-CM

## 2019-11-15 DIAGNOSIS — M79672 Pain in left foot: Secondary | ICD-10-CM | POA: Diagnosis not present

## 2019-11-15 DIAGNOSIS — M779 Enthesopathy, unspecified: Secondary | ICD-10-CM

## 2019-11-15 DIAGNOSIS — I739 Peripheral vascular disease, unspecified: Secondary | ICD-10-CM | POA: Diagnosis not present

## 2019-11-15 NOTE — Progress Notes (Signed)
Subjective: Frank Swanson is a 74 y.o. male patient returns office for follow-up evaluation of left big toe joint pain.  Patient reports after injection pain is doing better a little sore underneath 4 out of 10 but a lot better than previous denies any redness warmth swelling drainage or any other constitutional symptoms at this time.  Patient does admit that few days ago his left second toe got red but then it went away but no pain to this area.  Patient denies any other pedal complaints at this time.   Patient Active Problem List   Diagnosis Date Noted  . Hypertensive heart disease with heart failure (HCC) 05/02/2017  . Type 2 diabetes mellitus without complication, without long-term current use of insulin (HCC) 03/17/2017  . Cardiomyopathy (HCC) 05/06/2015  . CKD (chronic kidney disease) 05/06/2015  . Chronic combined systolic and diastolic heart failure (HCC) 05/06/2015  . High risk medication use 05/06/2015  . Hyperlipidemia 05/06/2015  . Mild CAD 05/06/2015    Current Outpatient Medications on File Prior to Visit  Medication Sig Dispense Refill  . aspirin EC 81 MG tablet Take 1 tablet (81 mg total) by mouth daily. 90 tablet 3  . carvedilol (COREG) 6.25 MG tablet Take 1 tablet (6.25 mg total) by mouth 2 (two) times daily. 180 tablet 1  . furosemide (LASIX) 20 MG tablet Take 1 tablet by mouth once daily 90 tablet 3  . lisinopril (ZESTRIL) 2.5 MG tablet Take 1 tablet by mouth once daily 90 tablet 3  . nitroGLYCERIN (NITROSTAT) 0.4 MG SL tablet DISSOLVE ONE TABLET UNDER THE TONGUE EVERY 5 MINUTES AS NEEDED FOR CHEST PAIN.  DO NOT EXCEED A TOTAL OF 3 DOSES IN 15 MINUTES 25 tablet 6  . omeprazole (PRILOSEC) 20 MG capsule Take 20 mg by mouth daily.    . potassium chloride SA (K-DUR) 20 MEQ tablet Take 1 tablet by mouth once daily 90 tablet 3  . pravastatin (PRAVACHOL) 20 MG tablet Take 1 tablet by mouth once daily 90 tablet 1  . spironolactone (ALDACTONE) 25 MG tablet Take 1 tablet by mouth  once daily 90 tablet 3   No current facility-administered medications on file prior to visit.    No Known Allergies  Objective:  General: Alert and oriented x3 in no acute distress  Dermatology: No swelling no redness warmth noted to the left first or second metatarsophalangeal joints, No open lesions bilateral lower extremities, no webspace macerations, no ecchymosis bilateral, all nails x 10 are well manicured.  Vascular: Dorsalis Pedis and Posterior Tibial pedal pulses 1/4, Capillary Fill Time 5 seconds,(-) pedal hair growth bilateral,Temperature gradient increased over the left first metatarsophalangeal joint mildly.  Neurology: Gross sensation intact via light touch bilateral.  Musculoskeletal: Decreased tenderness palpation to left first metatarsophalangeal joint, mid pad atrophy noted bilateral left greater than right, Strength within normal limits in all groups bilateral.       Assessment and Plan: Problem List Items Addressed This Visit    None    Visit Diagnoses    Acute idiopathic gout, unspecified site    -  Primary   Capsulitis       PVD (peripheral vascular disease) (HCC)       Left foot pain          -Complete examination performed -Discussed with patient resolving capsulitis -Applied metatarsal padding to left shoe -Advised patient to continue with topical pain cream, epsom soaking, rest, ice, elevation, and Tylenol as needed -Patient to return as needed or  sooner if condition worsens.  Landis Martins, DPM

## 2019-11-18 ENCOUNTER — Other Ambulatory Visit: Payer: Self-pay | Admitting: Cardiology

## 2020-01-01 ENCOUNTER — Other Ambulatory Visit: Payer: Self-pay | Admitting: Cardiology

## 2020-02-26 ENCOUNTER — Other Ambulatory Visit: Payer: Self-pay | Admitting: Cardiology

## 2020-04-12 NOTE — Progress Notes (Addendum)
Cardiology Office Note:    Date:  04/13/2020   ID:  Frank Swanson, DOB 1946/04/18, MRN [id number]  PCP:  Patient, No Pcp Per  Cardiologist:  Norman Herrlich, MD    Referring MD: No ref. provider found    ASSESSMENT:    1. Mild CAD   2. Hypertensive heart disease with heart failure (HCC)   3. Chronic systolic heart failure (HCC)    PLAN:    In order of problems listed above:  1. He has a stable pattern of exertional angina New York Heart Association class II continue treatment aspirin beta-blocker as needed nitroglycerin and lipid-lowering with statin.  He declines an ischemia evaluation at this time I offered him oral nitrates and he declined 2. Stable BP at target continue treatment ACE inhibitor diuretic beta-blocker and MRA. 3. Stable compensated no edema tenuous low-dose diuretic along with guideline directed therapy beta-blocker ACE inhibitor MRA and check renal function with his CKD   Next appointment: Months   Medication Adjustments/Labs and Tests Ordered: Current medicines are reviewed at length with the patient today.  Concerns regarding medicines are outlined above.  Orders Placed This Encounter  Procedures  . Comprehensive metabolic panel  . Lipid panel   No orders of the defined types were placed in this encounter.   Chief Complaint  Patient presents with  . Follow-up  . Coronary Artery Disease  . Congestive Heart Failure    History of Present Illness:    Frank Swanson is a 74 y.o. male with a hx of mild CAD heart failure mid range ejection fraction 40 to 45% chronic kidney disease and dyslipidemia being intolerant of high intensity statin last seen 08/07/2019.  Last visit we discussed echocardiogram to optimize medical therapy he declined is maintained on his loop diuretic beta-blocker ACE inhibitor and spironolactone.. Compliance with diet, lifestyle and medications: Yes  He has a stable pattern of exertional angina when he does garden work either relieved  with rest or nitroglycerin it takes 2 to 23-month.  He does not have rest episodes does not occur nocturnally has not changed in frequency.  We discussed an ischemia evaluation he declined I offered him additional antianginal therapy with oral nitrates he declines and is pleased with the quality of his life.  He is having no edema orthopnea palpitation or syncope. Past Medical History:  Diagnosis Date  . Cardiomyopathy (HCC) 05/06/2015  . Chronic combined systolic and diastolic heart failure (HCC) 05/06/2015  . 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 Chronic systolic heart failure (HCC) 05/06/2015  . CKD (chronic kidney disease) 05/06/2015  . High risk medication use 05/06/2015   Overview:  Digoxin  . Hyperlipidemia 05/06/2015  . Hypertensive heart disease with heart failure (HCC) 05/02/2017  . Mild CAD 05/06/2015  . Type 2 diabetes mellitus without complication, without long-term current use of insulin (HCC) 03/17/2017    Past Surgical History:  Procedure Laterality Date  . CARDIAC CATHETERIZATION      Current Medications: Current Meds  Medication Sig  . aspirin EC 81 MG tablet Take 1 tablet (81 mg total) by mouth daily.  . carvedilol (COREG) 6.25 MG tablet Take 1 tablet by mouth twice daily  . furosemide (LASIX) 20 MG tablet Take 1 tablet by mouth once daily  . glimepiride (AMARYL) 2 MG tablet Take 2 mg by mouth daily with breakfast.  . lisinopril (ZESTRIL) 2.5 MG tablet Take 1 tablet by mouth once daily  . nitroGLYCERIN (NITROSTAT) 0.4 MG SL tablet DISSOLVE ONE  TABLET UNDER THE TONGUE EVERY 5 MINUTES AS NEEDED FOR CHEST PAIN.  DO NOT EXCEED A TOTAL OF 3 DOSES IN 15 MINUTES  . omeprazole (PRILOSEC) 20 MG capsule Take 20 mg by mouth daily.  . potassium chloride SA (KLOR-CON) 20 MEQ tablet Take 1 tablet by mouth once daily  . pravastatin (PRAVACHOL) 20 MG tablet Take 1 tablet by mouth once daily  . spironolactone (ALDACTONE) 25 MG tablet Take 1 tablet by mouth once daily       Allergies:   Patient has no known allergies.   Social History   Socioeconomic History  . Marital status: Unknown    Spouse name: Not on file  . Number of children: Not on file  . Years of education: Not on file  . Highest education level: Not on file  Occupational History  . Not on file  Tobacco Use  . Smoking status: Current Every Day Smoker    Types: Cigarettes  . Smokeless tobacco: Never Used  Vaping Use  . Vaping Use: Never used  Substance and Sexual Activity  . Alcohol use: No  . Drug use: No  . Sexual activity: Not on file  Other Topics Concern  . Not on file  Social History Narrative  . Not on file   Social Determinants of Health   Financial Resource Strain:   . Difficulty of Paying Living Expenses: Not on file  Food Insecurity:   . Worried About Programme researcher, broadcasting/film/video in the Last Year: Not on file  . Ran Out of Food in the Last Year: Not on file  Transportation Needs:   . Lack of Transportation (Medical): Not on file  . Lack of Transportation (Non-Medical): Not on file  Physical Activity:   . Days of Exercise per Week: Not on file  . Minutes of Exercise per Session: Not on file  Stress:   . Feeling of Stress : Not on file  Social Connections:   . Frequency of Communication with Friends and Family: Not on file  . Frequency of Social Gatherings with Friends and Family: Not on file  . Attends Religious Services: Not on file  . Active Member of Clubs or Organizations: Not on file  . Attends Banker Meetings: Not on file  . Marital Status: Not on file     Family History: The patient's family history includes Cancer in his father; Heart failure in his mother. ROS:   Please see the history of present illness.    All other systems reviewed and are negative.  EKGs/Labs/Other Studies Reviewed:    The following studies were reviewed today:    Recent Labs: 08/07/2019: ALT 15; BUN 31; Creatinine, Ser 1.93; NT-Pro BNP 3,013; Potassium 5.0;  Sodium 139  Recent Lipid Panel    Component Value Date/Time   CHOL 209 (H) 08/07/2019 1626   TRIG 319 (H) 08/07/2019 1626   HDL 35 (L) 08/07/2019 1626   CHOLHDL 6.0 (H) 08/07/2019 1626   LDLCALC 118 (H) 08/07/2019 1626    Physical Exam:    VS:  BP 135/71   Pulse 70   Ht 5\' 9"  (1.753 m)   Wt 177 lb 9.6 oz (80.6 kg)   SpO2 96%   BMI 26.23 kg/m     Wt Readings from Last 3 Encounters:  04/13/20 177 lb 9.6 oz (80.6 kg)  08/07/19 180 lb 9.6 oz (81.9 kg)  07/05/18 188 lb 3.2 oz (85.4 kg)     GEN:  Well nourished, well developed  in no acute distress HEENT: Normal NECK: No JVD; No carotid bruits LYMPHATICS: No lymphadenopathy CARDIAC: RRR, no murmurs, rubs, gallops RESPIRATORY:  Clear to auscultation without rales, wheezing or rhonchi  ABDOMEN: Soft, non-tender, non-distended MUSCULOSKELETAL:  No edema; No deformity  SKIN: Warm and dry NEUROLOGIC:  Alert and oriented x 3 PSYCHIATRIC:  Normal affect    Signed, Norman Herrlich, MD  04/13/2020 9:50 AM    [facility]

## 2020-04-13 ENCOUNTER — Ambulatory Visit: Payer: Medicare Other | Admitting: Cardiology

## 2020-04-13 ENCOUNTER — Encounter: Payer: Self-pay | Admitting: Cardiology

## 2020-04-13 ENCOUNTER — Other Ambulatory Visit: Payer: Self-pay

## 2020-04-13 VITALS — BP 135/71 | HR 70 | Ht 69.0 in | Wt 177.6 lb

## 2020-04-13 DIAGNOSIS — I110 Hypertensive heart disease with heart failure: Secondary | ICD-10-CM | POA: Diagnosis not present

## 2020-04-13 DIAGNOSIS — I5022 Chronic systolic (congestive) heart failure: Secondary | ICD-10-CM | POA: Diagnosis not present

## 2020-04-13 DIAGNOSIS — I2510 Atherosclerotic heart disease of native coronary artery without angina pectoris: Secondary | ICD-10-CM

## 2020-04-13 LAB — COMPREHENSIVE METABOLIC PANEL
ALT: 15 IU/L (ref 0–44)
AST: 18 IU/L (ref 0–40)
Albumin/Globulin Ratio: 1.8 (ref 1.2–2.2)
Albumin: 4.3 g/dL (ref 3.7–4.7)
Alkaline Phosphatase: 75 IU/L (ref 44–121)
BUN/Creatinine Ratio: 16 (ref 10–24)
BUN: 22 mg/dL (ref 8–27)
Bilirubin Total: 0.4 mg/dL (ref 0.0–1.2)
CO2: 24 mmol/L (ref 20–29)
Calcium: 9.4 mg/dL (ref 8.6–10.2)
Chloride: 103 mmol/L (ref 96–106)
Creatinine, Ser: 1.37 mg/dL — ABNORMAL HIGH (ref 0.76–1.27)
GFR calc Af Amer: 58 mL/min/{1.73_m2} — ABNORMAL LOW (ref >59)
GFR calc non Af Amer: 50 mL/min/{1.73_m2} — ABNORMAL LOW (ref >59)
Globulin, Total: 2.4 g/dL (ref 1.5–4.5)
Glucose: 97 mg/dL (ref 65–99)
Potassium: 4.2 mmol/L (ref 3.5–5.2)
Sodium: 139 mmol/L (ref 134–144)
Total Protein: 6.7 g/dL (ref 6.0–8.5)

## 2020-04-13 LAB — LIPID PANEL
Chol/HDL Ratio: 4.3 ratio (ref 0.0–5.0)
Cholesterol, Total: 175 mg/dL (ref 100–199)
HDL: 41 mg/dL (ref >39)
LDL Chol Calc (NIH): 102 mg/dL — ABNORMAL HIGH (ref 0–99)
Triglycerides: 184 mg/dL — ABNORMAL HIGH (ref 0–149)
VLDL Cholesterol Cal: 32 mg/dL (ref 5–40)

## 2020-04-13 NOTE — Patient Instructions (Signed)

## 2020-04-14 ENCOUNTER — Telehealth: Payer: Self-pay

## 2020-04-14 NOTE — Telephone Encounter (Signed)
Per Dr. Munley:  Good result no changes in treatment    Spoke with patient regarding results and recommendation.  Patient verbalizes understanding and is agreeable to plan of care. Advised patient to call back with any issues or concerns.   

## 2020-05-14 ENCOUNTER — Other Ambulatory Visit: Payer: Self-pay | Admitting: Cardiology

## 2020-05-14 NOTE — Telephone Encounter (Signed)
Refill sent to pharmacy.   Potassium chloride SA 20 MEQ

## 2020-06-01 ENCOUNTER — Other Ambulatory Visit: Payer: Self-pay | Admitting: Cardiology

## 2020-06-01 NOTE — Telephone Encounter (Signed)
Refill sent to pharmacy.   

## 2020-06-10 ENCOUNTER — Other Ambulatory Visit: Payer: Self-pay | Admitting: Cardiology

## 2020-06-10 NOTE — Telephone Encounter (Signed)
Rx refill sent to pharmacy. 

## 2020-07-27 ENCOUNTER — Other Ambulatory Visit: Payer: Self-pay | Admitting: Cardiology

## 2020-07-27 NOTE — Telephone Encounter (Signed)
Nitroglycerin approved and sent  

## 2020-08-07 ENCOUNTER — Other Ambulatory Visit: Payer: Self-pay | Admitting: Cardiology

## 2020-08-07 NOTE — Telephone Encounter (Signed)
Potassium CL approved and sent 

## 2020-09-01 ENCOUNTER — Other Ambulatory Visit: Payer: Self-pay | Admitting: Cardiology

## 2020-09-07 ENCOUNTER — Other Ambulatory Visit: Payer: Self-pay | Admitting: Cardiology

## 2020-09-22 ENCOUNTER — Other Ambulatory Visit: Payer: Self-pay | Admitting: Cardiology

## 2021-03-30 ENCOUNTER — Other Ambulatory Visit: Payer: Self-pay

## 2021-03-30 ENCOUNTER — Encounter: Payer: Self-pay | Admitting: Cardiology

## 2021-03-30 ENCOUNTER — Ambulatory Visit: Payer: Medicare Other | Admitting: Cardiology

## 2021-03-30 VITALS — BP 110/60 | HR 88 | Ht 69.0 in | Wt 175.8 lb

## 2021-03-30 DIAGNOSIS — I110 Hypertensive heart disease with heart failure: Secondary | ICD-10-CM | POA: Diagnosis not present

## 2021-03-30 DIAGNOSIS — N189 Chronic kidney disease, unspecified: Secondary | ICD-10-CM | POA: Diagnosis not present

## 2021-03-30 DIAGNOSIS — I5042 Chronic combined systolic (congestive) and diastolic (congestive) heart failure: Secondary | ICD-10-CM | POA: Diagnosis not present

## 2021-03-30 DIAGNOSIS — E119 Type 2 diabetes mellitus without complications: Secondary | ICD-10-CM

## 2021-03-30 DIAGNOSIS — I2510 Atherosclerotic heart disease of native coronary artery without angina pectoris: Secondary | ICD-10-CM | POA: Diagnosis not present

## 2021-03-30 DIAGNOSIS — E782 Mixed hyperlipidemia: Secondary | ICD-10-CM

## 2021-03-30 NOTE — Progress Notes (Signed)
Cardiology Office Note:    Date:  03/30/2021   ID:  Frank Swanson, DOB 08-27-1945, MRN [id number]  PCP:  Patient, No Pcp Per (Inactive)  Cardiologist:  Norman Herrlich, MD   Referring MD: No ref. provider found  ASSESSMENT:    1. Mild CAD   2. Hypertensive heart disease with heart failure (HCC)   3. Chronic combined systolic and diastolic heart failure (HCC)   4. Chronic kidney disease, unspecified CKD stage   5. Mixed hyperlipidemia   6. Type 2 diabetes mellitus without complication, without long-term current use of insulin (HCC)    PLAN:    In order of problems listed above:  Overall Mr. [name] is doing well not having typical anginal pattern is unchanged he does not want to undergo an ischemic evaluation at this time continue continue treatment including aspirin statin nitroglycerin and beta-blocker.  He is assured me if his symptoms change worsen or become more typical and contact me and I told him at that time I have located coronary angiography and revascularization if appropriate. Stable BP at target continue treatment including beta-blocker ACE diuretic recheck renal function potassium Recheck renal function continue current treatment including MRA Continue with statin he is intolerant of high intensity statins and recheck LDL lipid profile liver function He has had no medical interaction last year we will check a hemoglobin A1c please on Amaryl  Next appointment 1 year   Medication Adjustments/Labs and Tests Ordered: Current medicines are reviewed at length with the patient today.  Concerns regarding medicines are outlined above.  No orders of the defined types were placed in this encounter.  No orders of the defined types were placed in this encounter.     Chief complaint: Follow-up for CAD  History of Present Illness:    Frank Swanson is a 75 y.o. male who is being seen today for mild CAD heart failure ejection fraction 40 to 45% chronic kidney disease and  dyslipidemia being intolerant of high intensity statin last seen 04/13/2020 and yearly follow-up.  At that time he had a stable pattern of exertional angina and declined an ischemic evaluation as heart failure was compensated.  I would best describe Frank Swanson is unchanged occasionally once or twice a month he has chest discomfort with and without activities at times relieved with belching and at times nitroglycerin with relief the pattern has not changed and is not exertional chest pain.  He has had no edema shortness of breath palpitation or syncope and has had no medical interactions since his last visit with me.  He tolerates his low intensity statin previously intolerant of atorvastatin. Past Medical History:  Diagnosis Date   Cardiomyopathy (HCC) 05/06/2015   Chronic combined systolic and diastolic heart failure (HCC) 05/06/2015   Chronic kidney disease, stage II (mild) 05/06/2015   Chronic systolic heart failure (HCC) 05/06/2015   CKD (chronic kidney disease) 05/06/2015   High risk medication use 05/06/2015   Overview:  Digoxin   Hyperlipidemia 05/06/2015   Hypertensive heart disease with heart failure (HCC) 05/02/2017   Mild CAD 05/06/2015   Type 2 diabetes mellitus without complication, without long-term current use of insulin (HCC) 03/17/2017    Past Surgical History:  Procedure Laterality Date   CARDIAC CATHETERIZATION      Current Medications: Current Meds  Medication Sig   aspirin EC 81 MG tablet Take 1 tablet (81 mg total) by mouth daily.   carvedilol (COREG) 6.25 MG tablet Take 1 tablet by mouth twice daily  furosemide (LASIX) 20 MG tablet Take 1 tablet by mouth once daily   glimepiride (AMARYL) 2 MG tablet Take 2 mg by mouth daily with breakfast.   lisinopril (ZESTRIL) 2.5 MG tablet Take 1 tablet by mouth once daily   nitroGLYCERIN (NITROSTAT) 0.4 MG SL tablet DISSOLVE ONE TABLET UNDER THE TONGUE EVERY 5 MINUTES AS NEEDED FOR CHEST PAIN.  DO NOT EXCEED A TOTAL OF 3 DOSES  IN 15 MINUTES   omeprazole (PRILOSEC) 20 MG capsule Take 20 mg by mouth daily.   potassium chloride SA (KLOR-CON) 20 MEQ tablet TAKE 1  BY MOUTH ONCE DAILY   pravastatin (PRAVACHOL) 20 MG tablet Take 1 tablet by mouth once daily   spironolactone (ALDACTONE) 25 MG tablet Take 1 tablet by mouth once daily     Allergies:   Patient has no known allergies.   Social History   Socioeconomic History   Marital status: Unknown    Spouse name: Not on file   Number of children: Not on file   Years of education: Not on file   Highest education level: Not on file  Occupational History   Not on file  Tobacco Use   Smoking status: Every Day    Types: Cigarettes   Smokeless tobacco: Never  Vaping Use   Vaping Use: Never used  Substance and Sexual Activity   Alcohol use: No   Drug use: No   Sexual activity: Not on file  Other Topics Concern   Not on file  Social History Narrative   Not on file   Social Determinants of Health   Financial Resource Strain: Not on file  Food Insecurity: Not on file  Transportation Needs: Not on file  Physical Activity: Not on file  Stress: Not on file  Social Connections: Not on file     Family History: The patient's  family history includes Cancer in his father; Heart failure in his mother.  ROS:   ROS Please see the history of present illness.      All other systems reviewed and are negative.  EKGs/Labs/Other Studies Reviewed:    The following studies were reviewed today:    EKG:  EKG is   ordered today.  The ekg ordered today is personally reviewed and demonstrates sinus rhythm with occasional PVC  Recent Labs: 04/13/2020: ALT 15; BUN 22; Creatinine, Ser 1.37; Potassium 4.2; Sodium 139  Recent Lipid Panel    Component Value Date/Time   CHOL 175 04/13/2020 0958   TRIG 184 (H) 04/13/2020 0958   HDL 41 04/13/2020 0958   CHOLHDL 4.3 04/13/2020 0958   LDLCALC 102 (H) 04/13/2020 0958    Physical Exam:    VS:  BP 110/60   Pulse 88    Ht 5\' 9"  (1.753 m)   Wt 175 lb 12.8 oz (79.7 kg)   SpO2 97%   BMI 25.96 kg/m     Wt Readings from Last 3 Encounters:  03/30/21 175 lb 12.8 oz (79.7 kg)  04/13/20 177 lb 9.6 oz (80.6 kg)  08/07/19 180 lb 9.6 oz (81.9 kg)     GEN: He appears his age well nourished, well developed in no acute distress HEENT: Normal NECK: No JVD; No carotid bruits LYMPHATICS: No lymphadenopathy CARDIAC: RRR, no murmurs, rubs, gallops RESPIRATORY:  Clear to auscultation without rales, wheezing or rhonchi  ABDOMEN: Soft, non-tender, non-distended MUSCULOSKELETAL:  No edema; No deformity  SKIN: Warm and dry NEUROLOGIC:  Alert and oriented x 3 PSYCHIATRIC:  Normal affect  Signed, Shirlee More, MD  03/30/2021 10:07 AM    [place]

## 2021-03-30 NOTE — Patient Instructions (Signed)
Medication Instructions:  Your physician recommends that you continue on your current medications as directed. Please refer to the Current Medication list given to you today.  *If you need a refill on your cardiac medications before your next appointment, please call your pharmacy*   Lab Work: Your physician recommends that you return for lab work in: TODAY HGB A1C, CMP, Lipids If you have labs (blood work) drawn today and your tests are completely normal, you will receive your results only by: MyChart Message (if you have MyChart) OR A paper copy in the mail If you have any lab test that is abnormal or we need to change your treatment, we will call you to review the results.   Testing/Procedures: None   Follow-Up: At [facility], you and your health needs are our priority.  As part of our continuing mission to provide you with exceptional heart care, we have created designated Provider Care Teams.  These Care Teams include your primary Cardiologist (physician) and Advanced Practice Providers (APPs -  Physician Assistants and Nurse Practitioners) who all work together to provide you with the care you need, when you need it.  We recommend signing up for the patient portal called "MyChart".  Sign up information is provided on this After Visit Summary.  MyChart is used to connect with patients for Virtual Visits (Telemedicine).  Patients are able to view lab/test results, encounter notes, upcoming appointments, etc.  Non-urgent messages can be sent to your provider as well.   To learn more about what you can do with MyChart, go to [email or web address].    Your next appointment:   1 year(s)  The format for your next appointment:   In Person  Provider:   Norman Herrlich, MD    Other Instructions

## 2021-03-31 LAB — COMPREHENSIVE METABOLIC PANEL
ALT: 12 IU/L (ref 0–44)
AST: 19 IU/L (ref 0–40)
Albumin/Globulin Ratio: 1.8 (ref 1.2–2.2)
Albumin: 4.3 g/dL (ref 3.7–4.7)
Alkaline Phosphatase: 78 IU/L (ref 44–121)
BUN/Creatinine Ratio: 15 (ref 10–24)
BUN: 19 mg/dL (ref 8–27)
Bilirubin Total: 0.5 mg/dL (ref 0.0–1.2)
CO2: 22 mmol/L (ref 20–29)
Calcium: 9.1 mg/dL (ref 8.6–10.2)
Chloride: 98 mmol/L (ref 96–106)
Creatinine, Ser: 1.28 mg/dL — ABNORMAL HIGH (ref 0.76–1.27)
Globulin, Total: 2.4 g/dL (ref 1.5–4.5)
Glucose: 118 mg/dL — ABNORMAL HIGH (ref 70–99)
Potassium: 4.2 mmol/L (ref 3.5–5.2)
Sodium: 134 mmol/L (ref 134–144)
Total Protein: 6.7 g/dL (ref 6.0–8.5)
eGFR: 58 mL/min/{1.73_m2} — ABNORMAL LOW (ref >59)

## 2021-03-31 LAB — HEMOGLOBIN A1C
Est. average glucose Bld gHb Est-mCnc: 114 mg/dL
Hgb A1c MFr Bld: 5.6 % (ref 4.8–5.6)

## 2021-03-31 LAB — LIPID PANEL
Chol/HDL Ratio: 3.8 ratio (ref 0.0–5.0)
Cholesterol, Total: 156 mg/dL (ref 100–199)
HDL: 41 mg/dL (ref >39)
LDL Chol Calc (NIH): 81 mg/dL (ref 0–99)
Triglycerides: 200 mg/dL — ABNORMAL HIGH (ref 0–149)
VLDL Cholesterol Cal: 34 mg/dL (ref 5–40)

## 2021-04-19 ENCOUNTER — Telehealth: Payer: Self-pay | Admitting: Cardiology

## 2021-04-19 NOTE — Telephone Encounter (Signed)
Patient came by office stating he had labs drawn on 03/30/21 and has not heard any results. Patient requested a phone call with results and results mailed to him.

## 2021-04-19 NOTE — Telephone Encounter (Signed)
Spoke with patient regarding results and recommendation.  Patient verbalizes understanding and is agreeable to plan of care. Advised patient to call back with any issues or concerns.  

## 2021-04-23 ENCOUNTER — Other Ambulatory Visit: Payer: Self-pay

## 2021-04-23 MED ORDER — PRAVASTATIN SODIUM 20 MG PO TABS: 20.0000 mg | ORAL_TABLET | Freq: Every day | ORAL | 2 refills | Status: DC

## 2021-04-23 NOTE — Telephone Encounter (Signed)
Refill of Pravastatin 20 mg sent to [facility].

## 2021-04-26 ENCOUNTER — Other Ambulatory Visit: Payer: Self-pay | Admitting: Cardiology

## 2021-06-28 ENCOUNTER — Other Ambulatory Visit: Payer: Self-pay | Admitting: Cardiology

## 2021-07-21 ENCOUNTER — Other Ambulatory Visit: Payer: Self-pay | Admitting: Cardiology

## 2021-10-12 ENCOUNTER — Telehealth: Payer: Self-pay | Admitting: Cardiology

## 2021-10-12 DIAGNOSIS — R079 Chest pain, unspecified: Secondary | ICD-10-CM

## 2021-10-12 NOTE — Addendum Note (Signed)
Addended by: Eleonore Chiquito on: 10/12/2021 02:23 PM   Modules accepted: Orders

## 2021-10-12 NOTE — Addendum Note (Signed)
Addended by: Norman Herrlich on: 10/12/2021 03:08 PM   Modules accepted: Orders

## 2021-10-12 NOTE — Telephone Encounter (Signed)
Please sign attestation as pt is coming in the morning for the lexi.

## 2021-10-12 NOTE — Telephone Encounter (Signed)
Spoke with pt and he states he has upper right sided chest pain and he takes NTG which relieves the pain. Pt denies radiation of pain , diaphoresis or N/V with associated pain. Pt states that he got the refill today as he is going out of town.  Please advise.  Last office note 03/30/21:  PLAN:     In order of problems listed above:   Overall Frank Swanson is doing well not having typical anginal pattern is unchanged he does not want to undergo an ischemic evaluation at this time continue continue treatment including aspirin statin nitroglycerin and beta-blocker.  He is assured me if his symptoms change worsen or become more typical and contact me and I told him at that time I have located coronary angiography and revascularization if appropriate. Stable BP at target continue treatment including beta-blocker ACE diuretic recheck renal function potassium Recheck renal function continue current treatment including MRA Continue with statin he is intolerant of high intensity statins and recheck LDL lipid profile liver function He has had no medical interaction last year we will check a hemoglobin A1c please on Amaryl   Next appointment 1 year

## 2021-10-12 NOTE — Telephone Encounter (Signed)
Pt c/o of Chest Pain: STAT if CP now or developed within 24 hours  1. Are you having CP right now?  Not currently with patient  2. Are you experiencing any other symptoms (ex. SOB, nausea, vomiting, sweating)?  Unsure   3. How long have you been experiencing CP?  Since at least 4/01, on and off per Stacy  4. Is your CP continuous or coming and going?  Coming and going  5. Have you taken Nitroglycerin?   Stacy with [facility] called in concerned because the patient has recently gotten about 25 Nitroglycerin tabs every 10 days since about 4/03. She states the patient mentioned CP since the beginning of April and has been getting Nitro a lot. Some days the patient is taking 2-3 tabs. Other days he doesn't take any, but Kennyth Arnold assumes patient needs to be seen by Dr. Dulce Sellar soon. Please advise. ?

## 2021-10-13 ENCOUNTER — Other Ambulatory Visit: Payer: Self-pay

## 2021-10-13 ENCOUNTER — Inpatient Hospital Stay (HOSPITAL_COMMUNITY)
Admission: EM | Admit: 2021-10-13 | Discharge: 2021-10-15 | DRG: 281 | Disposition: A | Discharge status: Home / Self Care | Payer: Medicare Other | Attending: Internal Medicine | Admitting: Internal Medicine

## 2021-10-13 ENCOUNTER — Telehealth: Payer: Self-pay

## 2021-10-13 ENCOUNTER — Emergency Department (HOSPITAL_COMMUNITY): Payer: Medicare Other

## 2021-10-13 ENCOUNTER — Ambulatory Visit (INDEPENDENT_AMBULATORY_CARE_PROVIDER_SITE_OTHER): Payer: Medicare Other | Admitting: Cardiology

## 2021-10-13 ENCOUNTER — Encounter (HOSPITAL_COMMUNITY): Payer: Self-pay

## 2021-10-13 ENCOUNTER — Ambulatory Visit (INDEPENDENT_AMBULATORY_CARE_PROVIDER_SITE_OTHER): Payer: Medicare Other

## 2021-10-13 VITALS — BP 130/70 | HR 104 | Ht 69.0 in | Wt 179.0 lb

## 2021-10-13 DIAGNOSIS — E1122 Type 2 diabetes mellitus with diabetic chronic kidney disease: Secondary | ICD-10-CM | POA: Diagnosis present

## 2021-10-13 DIAGNOSIS — R9439 Abnormal result of other cardiovascular function study: Secondary | ICD-10-CM

## 2021-10-13 DIAGNOSIS — I110 Hypertensive heart disease with heart failure: Secondary | ICD-10-CM | POA: Diagnosis present

## 2021-10-13 DIAGNOSIS — R079 Chest pain, unspecified: Secondary | ICD-10-CM | POA: Diagnosis not present

## 2021-10-13 DIAGNOSIS — E119 Type 2 diabetes mellitus without complications: Secondary | ICD-10-CM

## 2021-10-13 DIAGNOSIS — F17210 Nicotine dependence, cigarettes, uncomplicated: Secondary | ICD-10-CM | POA: Diagnosis present

## 2021-10-13 DIAGNOSIS — I130 Hypertensive heart and chronic kidney disease with heart failure and stage 1 through stage 4 chronic kidney disease, or unspecified chronic kidney disease: Secondary | ICD-10-CM | POA: Diagnosis present

## 2021-10-13 DIAGNOSIS — I5042 Chronic combined systolic (congestive) and diastolic (congestive) heart failure: Secondary | ICD-10-CM

## 2021-10-13 DIAGNOSIS — E782 Mixed hyperlipidemia: Secondary | ICD-10-CM

## 2021-10-13 DIAGNOSIS — N182 Chronic kidney disease, stage 2 (mild): Secondary | ICD-10-CM | POA: Diagnosis present

## 2021-10-13 DIAGNOSIS — I493 Ventricular premature depolarization: Secondary | ICD-10-CM | POA: Diagnosis present

## 2021-10-13 DIAGNOSIS — Z7982 Long term (current) use of aspirin: Secondary | ICD-10-CM

## 2021-10-13 DIAGNOSIS — I214 Non-ST elevation (NSTEMI) myocardial infarction: Secondary | ICD-10-CM | POA: Diagnosis not present

## 2021-10-13 DIAGNOSIS — I255 Ischemic cardiomyopathy: Secondary | ICD-10-CM | POA: Diagnosis present

## 2021-10-13 DIAGNOSIS — E785 Hyperlipidemia, unspecified: Secondary | ICD-10-CM | POA: Diagnosis present

## 2021-10-13 DIAGNOSIS — I3139 Other pericardial effusion (noninflammatory): Secondary | ICD-10-CM | POA: Diagnosis present

## 2021-10-13 DIAGNOSIS — I2510 Atherosclerotic heart disease of native coronary artery without angina pectoris: Secondary | ICD-10-CM | POA: Diagnosis present

## 2021-10-13 DIAGNOSIS — Z8249 Family history of ischemic heart disease and other diseases of the circulatory system: Secondary | ICD-10-CM

## 2021-10-13 DIAGNOSIS — Z79899 Other long term (current) drug therapy: Secondary | ICD-10-CM

## 2021-10-13 LAB — CBC WITH DIFFERENTIAL/PLATELET
Abs Immature Granulocytes: 0.02 10*3/uL (ref 0.00–0.07)
Basophils Absolute: 0.1 10*3/uL (ref 0.0–0.1)
Basophils Relative: 1 %
Eosinophils Absolute: 0 10*3/uL (ref 0.0–0.5)
Eosinophils Relative: 1 %
HCT: 40.1 % (ref 39.0–52.0)
Hemoglobin: 13.8 g/dL (ref 13.0–17.0)
Immature Granulocytes: 0 %
Lymphocytes Relative: 21 %
Lymphs Abs: 1.4 10*3/uL (ref 0.7–4.0)
MCH: 30.6 pg (ref 26.0–34.0)
MCHC: 34.4 g/dL (ref 30.0–36.0)
MCV: 88.9 fL (ref 80.0–100.0)
Monocytes Absolute: 0.5 10*3/uL (ref 0.1–1.0)
Monocytes Relative: 8 %
Neutro Abs: 4.4 10*3/uL (ref 1.7–7.7)
Neutrophils Relative %: 69 %
Platelets: 189 10*3/uL (ref 150–400)
RBC: 4.51 MIL/uL (ref 4.22–5.81)
RDW: 11.9 % (ref 11.5–15.5)
WBC: 6.4 10*3/uL (ref 4.0–10.5)
nRBC: 0 % (ref 0.0–0.2)

## 2021-10-13 LAB — COMPREHENSIVE METABOLIC PANEL
ALT: 15 U/L (ref 0–44)
AST: 27 U/L (ref 15–41)
Albumin: 3.3 g/dL — ABNORMAL LOW (ref 3.5–5.0)
Alkaline Phosphatase: 54 U/L (ref 38–126)
Anion gap: 8 (ref 5–15)
BUN: 12 mg/dL (ref 8–23)
CO2: 22 mmol/L (ref 22–32)
Calcium: 8.6 mg/dL — ABNORMAL LOW (ref 8.9–10.3)
Chloride: 104 mmol/L (ref 98–111)
Creatinine, Ser: 1.09 mg/dL (ref 0.61–1.24)
GFR, Estimated: >60 mL/min (ref >60)
Glucose, Bld: 118 mg/dL — ABNORMAL HIGH (ref 70–99)
Potassium: 3.5 mmol/L (ref 3.5–5.1)
Sodium: 134 mmol/L — ABNORMAL LOW (ref 135–145)
Total Bilirubin: 1.1 mg/dL (ref 0.3–1.2)
Total Protein: 6.3 g/dL — ABNORMAL LOW (ref 6.5–8.1)

## 2021-10-13 LAB — GLUCOSE, CAPILLARY
Glucose-Capillary: 84 mg/dL (ref 70–99)
Glucose-Capillary: 90 mg/dL (ref 70–99)

## 2021-10-13 LAB — TROPONIN I (HIGH SENSITIVITY)
Troponin I (High Sensitivity): 666 ng/L (ref <18)
Troponin I (High Sensitivity): 1442 ng/L (ref <18)

## 2021-10-13 LAB — HEMOGLOBIN A1C
Hgb A1c MFr Bld: 5.5 % (ref 4.8–5.6)
Mean Plasma Glucose: 111.15 mg/dL

## 2021-10-13 LAB — HEPARIN LEVEL (UNFRACTIONATED): Heparin Unfractionated: 0.14 IU/mL — ABNORMAL LOW (ref 0.30–0.70)

## 2021-10-13 MED ORDER — LISINOPRIL 5 MG PO TABS
2.5000 mg | ORAL_TABLET | Freq: Every day | ORAL | Status: DC
  Administered 2021-10-13: 2.5 mg via ORAL
  Filled 2021-10-13: qty 1

## 2021-10-13 MED ORDER — ACETAMINOPHEN 325 MG PO TABS: 650.0000 mg | ORAL_TABLET | ORAL | Status: DC | PRN

## 2021-10-13 MED ORDER — INSULIN ASPART 100 UNIT/ML IJ SOLN: 0.0000 [IU] | Freq: Three times a day (TID) | INTRAMUSCULAR | Status: DC

## 2021-10-13 MED ORDER — SODIUM CHLORIDE 0.9 % IV SOLN: 250.0000 mL | INTRAVENOUS | Status: DC | PRN

## 2021-10-13 MED ORDER — ASPIRIN 81 MG PO TBEC
81.0000 mg | DELAYED_RELEASE_TABLET | Freq: Every day | ORAL | Status: DC
  Administered 2021-10-15: 81 mg via ORAL
  Filled 2021-10-13: qty 1

## 2021-10-13 MED ORDER — TECHNETIUM TC 99M TETROFOSMIN IV KIT
30.5000 | PACK | Freq: Once | INTRAVENOUS | Status: AC | PRN
  Administered 2021-10-13: 30.5 via INTRAVENOUS

## 2021-10-13 MED ORDER — HEPARIN BOLUS VIA INFUSION
4000.0000 [IU] | Freq: Once | INTRAVENOUS | Status: AC
  Administered 2021-10-13: 4000 [IU] via INTRAVENOUS
  Filled 2021-10-13: qty 4000

## 2021-10-13 MED ORDER — NITROGLYCERIN 0.4 MG SL SUBL: 0.4000 mg | SUBLINGUAL_TABLET | SUBLINGUAL | Status: DC | PRN

## 2021-10-13 MED ORDER — TECHNETIUM TC 99M TETROFOSMIN IV KIT
10.0000 | PACK | Freq: Once | INTRAVENOUS | Status: AC | PRN
  Administered 2021-10-13: 10 via INTRAVENOUS

## 2021-10-13 MED ORDER — SODIUM CHLORIDE 0.9 % IV SOLN: INTRAVENOUS | Status: DC

## 2021-10-13 MED ORDER — AMINOPHYLLINE 25 MG/ML IV SOLN
50.0000 mg | Freq: Once | INTRAVENOUS | Status: AC
  Administered 2021-10-13: 50 mg via INTRAVENOUS

## 2021-10-13 MED ORDER — CARVEDILOL 6.25 MG PO TABS
6.2500 mg | ORAL_TABLET | Freq: Two times a day (BID) | ORAL | Status: DC
  Administered 2021-10-13 – 2021-10-14 (×2): 6.25 mg via ORAL
  Filled 2021-10-13 (×2): qty 1

## 2021-10-13 MED ORDER — SODIUM CHLORIDE 0.9% FLUSH: 3.0000 mL | INTRAVENOUS | Status: DC | PRN

## 2021-10-13 MED ORDER — ONDANSETRON HCL 4 MG/2ML IJ SOLN: 4.0000 mg | Freq: Four times a day (QID) | INTRAMUSCULAR | Status: DC | PRN

## 2021-10-13 MED ORDER — ASPIRIN 81 MG PO CHEW
81.0000 mg | CHEWABLE_TABLET | ORAL | Status: AC
  Administered 2021-10-14: 81 mg via ORAL
  Filled 2021-10-13: qty 1

## 2021-10-13 MED ORDER — SPIRONOLACTONE 25 MG PO TABS
25.0000 mg | ORAL_TABLET | Freq: Every day | ORAL | Status: DC
  Administered 2021-10-13 – 2021-10-14 (×2): 25 mg via ORAL
  Filled 2021-10-13 (×2): qty 1

## 2021-10-13 MED ORDER — ATORVASTATIN CALCIUM 80 MG PO TABS
80.0000 mg | ORAL_TABLET | Freq: Every day | ORAL | Status: DC
  Administered 2021-10-13 – 2021-10-15 (×3): 80 mg via ORAL
  Filled 2021-10-13 (×3): qty 1

## 2021-10-13 MED ORDER — ASPIRIN 81 MG PO CHEW: 324.0000 mg | CHEWABLE_TABLET | Freq: Once | ORAL | Status: DC

## 2021-10-13 MED ORDER — REGADENOSON 0.4 MG/5ML IV SOLN
0.4000 mg | Freq: Once | INTRAVENOUS | Status: AC
  Administered 2021-10-13: 0.4 mg via INTRAVENOUS

## 2021-10-13 MED ORDER — AMINOPHYLLINE 25 MG/ML IV SOLN
75.0000 mg | Freq: Once | INTRAVENOUS | Status: AC
  Administered 2021-10-13: 75 mg via INTRAVENOUS

## 2021-10-13 MED ORDER — SODIUM CHLORIDE 0.9 % WEIGHT BASED INFUSION: 1.0000 mL/kg/h | INTRAVENOUS | Status: DC

## 2021-10-13 MED ORDER — EMPAGLIFLOZIN 10 MG PO TABS
10.0000 mg | ORAL_TABLET | Freq: Every day | ORAL | Status: DC
  Administered 2021-10-13 – 2021-10-15 (×3): 10 mg via ORAL
  Filled 2021-10-13 (×3): qty 1

## 2021-10-13 MED ORDER — SODIUM CHLORIDE 0.9% FLUSH
3.0000 mL | Freq: Two times a day (BID) | INTRAVENOUS | Status: DC
  Administered 2021-10-13 – 2021-10-15 (×3): 3 mL via INTRAVENOUS

## 2021-10-13 MED ORDER — HEPARIN (PORCINE) 25000 UT/250ML-% IV SOLN
1350.0000 [IU]/h | INTRAVENOUS | Status: DC
  Administered 2021-10-13: 1000 [IU]/h via INTRAVENOUS
  Administered 2021-10-14: 1350 [IU]/h via INTRAVENOUS
  Filled 2021-10-13 (×2): qty 250

## 2021-10-13 MED ORDER — SODIUM CHLORIDE 0.9 % WEIGHT BASED INFUSION: 3.0000 mL/kg/h | INTRAVENOUS | Status: DC

## 2021-10-13 NOTE — Progress Notes (Signed)
ANTICOAGULATION CONSULT NOTE - Initial Consult  Pharmacy Consult for Heparin Indication: chest pain/ACS  No Known Allergies  Patient Measurements: Height: 5\' 9"  (175.3 cm) Weight: 81.2 kg (179 lb) IBW/kg (Calculated) : 70.7 Heparin Dosing Weight: 81.2 kg   Vital Signs: Temp: 98.3 F (36.8 C) (05/31 1309) Temp Source: Oral (05/31 1309) BP: 126/82 (05/31 1309) Pulse Rate: 102 (05/31 1309)  Labs: Recent Labs    10/13/21 1315  HGB 13.8  HCT 40.1  PLT 189    CrCl cannot be calculated (Patient's most recent lab result is older than the maximum 21 days allowed.).   Medical History: Past Medical History:  Diagnosis Date   Cardiomyopathy ([place]) 05/06/2015   Chronic combined systolic and diastolic heart failure ([place]) 05/06/2015   Chronic kidney disease, stage II (mild) [id number]   Chronic systolic heart failure ([place]) 05/06/2015   CKD (chronic kidney disease) 05/06/2015   High risk medication use 05/06/2015   Overview:  Digoxin   Hyperlipidemia 05/06/2015   Hypertensive heart disease with heart failure ([place]) 05/02/2017   Mild CAD 05/06/2015   Type 2 diabetes mellitus without complication, without long-term current use of insulin ([place]) 03/17/2017    Medications:  Scheduled:   aspirin  324 mg Oral Once   Infusions:   sodium chloride      Assessment: 76 yo M presenting with ACS, not on anticoagulation PTA. Baseline Hgb 13.8, plt 189. No signs/symptoms of bleeding noted. Pharmacy consulted for heparin dosing.   Goal of Therapy:  Heparin level 0.3-0.7 units/ml Monitor platelets by anticoagulation protocol: Yes   Plan:  Give heparin bolus 4000 units x 1, followed by heparin infusion at 1000 units/hr. Check ~ 8 hr heparin level.  Daily CBC, heparin level. Monitor for signs/symptoms of bleeding.   Vance Peper, PharmD PGY1 Pharmacy Resident 10/13/2021 1:29 PM   Please check AMION for all [place] phone numbers After 10:00 PM, call [place] [phone]

## 2021-10-13 NOTE — Progress Notes (Signed)
Cardiology Office Note:    Date:  10/13/2021   ID:  Frank Swanson, DOB 1945/08/01, MRN [id number]  PCP:  Patient, No Pcp Per (Inactive)  Cardiologist:  Jenne Campus, MD    Referring MD: No ref. provider found   Chief Complaint  Patient presents with   Abnormal stress test    History of Present Illness:    Frank Swanson is a 76 y.o. male with past medical history significant for coronary artery disease more than 20 years ago he had cardiac catheterization done which showed nonobstructive disease, essential hypertension, chronic kidney failure, type 2 diabetes, dyslipidemia.  Still is quite interesting apparently he did see my partner dr Bettina Gavia months ago, at that time it look like he got stable angina pectoris and he refused any ischemia evaluation at that time.  However we received phone call from pharmacist who alarm is about the fact that he required frequent refill on his nitroglycerin.  He was scheduled to have a stress test and I met him today after stress test.  His stress test is grossly abnormal he does have ischemia in LAD territory mid and distal portion of anterior wall does have fixed defect with reversibility around it.  He did have prolonged episode of chest pain after Lexiscan being given he was giving aminophylline as well as nitroglycerin with eventually almost complete relief what is worrisome is the fact that his ejection fraction on the Lexiscan was only 18%.  At the moment of my interview he is asymptomatic however considering the fact that any effort will give him chest pain.  He said when he goes back and forth to the mailbox he have to take at least 1 or 2 nitroglycerin.  The fact that he does have significant diminished left ventricle ejection fraction and abnormal stress test he need to be evaluated in the hospital most likely with coronary angiography.  I spent probably half an hour talking to him tried to explain to him what the rationale for this approach is and what  the situation is, finally he agreed.  At the moment my interview he was completely asymptomatic  Past Medical History:  Diagnosis Date   Cardiomyopathy ([place]) 05/06/2015   Chronic combined systolic and diastolic heart failure ([place]) 05/06/2015   Chronic kidney disease, stage II (mild) 49/20/1007   Chronic systolic heart failure ([place]) 05/06/2015   CKD (chronic kidney disease) 05/06/2015   High risk medication use 05/06/2015   Overview:  Digoxin   Hyperlipidemia 05/06/2015   Hypertensive heart disease with heart failure ([place]) 05/02/2017   Mild CAD 05/06/2015   Type 2 diabetes mellitus without complication, without long-term current use of insulin ([place]) 03/17/2017    Past Surgical History:  Procedure Laterality Date   CARDIAC CATHETERIZATION      Current Medications: Current Meds  Medication Sig   aspirin EC 81 MG tablet Take 1 tablet (81 mg total) by mouth daily.   carvedilol (COREG) 6.25 MG tablet Take 1 tablet by mouth twice daily   furosemide (LASIX) 20 MG tablet Take 1 tablet by mouth once daily   glimepiride (AMARYL) 2 MG tablet Take 2 mg by mouth daily with breakfast.   lisinopril (ZESTRIL) 2.5 MG tablet Take 1 tablet by mouth once daily   nitroGLYCERIN (NITROSTAT) 0.4 MG SL tablet DISSOLVE ONE TABLET UNDER THE TONGUE EVERY 5 MINUTES AS NEEDED FOR CHEST PAIN.  DO NOT EXCEED A TOTAL OF 3 DOSES IN 15 MINUTES   omeprazole (PRILOSEC) 20 MG capsule Take 20  mg by mouth daily.   potassium chloride SA (KLOR-CON M) 20 MEQ tablet Take 1 tablet (20 mEq total) by mouth daily.   pravastatin (PRAVACHOL) 20 MG tablet Take 1 tablet (20 mg total) by mouth daily.   spironolactone (ALDACTONE) 25 MG tablet Take 1 tablet by mouth once daily     Allergies:   Patient has no known allergies.   Social History   Socioeconomic History   Marital status: Unknown    Spouse name: Not on file   Number of children: Not on file   Years of education: Not on file   Highest education level: Not on file   Occupational History   Not on file  Tobacco Use   Smoking status: Every Day    Types: Cigarettes   Smokeless tobacco: Never  Vaping Use   Vaping Use: Never used  Substance and Sexual Activity   Alcohol use: No   Drug use: No   Sexual activity: Not on file  Other Topics Concern   Not on file  Social History Narrative   Not on file   Social Determinants of Health   Financial Resource Strain: Not on file  Food Insecurity: Not on file  Transportation Needs: Not on file  Physical Activity: Not on file  Stress: Not on file  Social Connections: Not on file     Family History: The patient's family history includes Cancer in his father; Heart failure in his mother. ROS:   Please see the history of present illness.    All 14 point review of systems negative except as described per history of present illness  EKGs/Labs/Other Studies Reviewed:      Recent Labs: 03/30/2021: ALT 12; BUN 19; Creatinine, Ser 1.28; Potassium 4.2; Sodium 134  Recent Lipid Panel    Component Value Date/Time   CHOL 156 03/30/2021 1011   TRIG 200 (H) 03/30/2021 1011   HDL 41 03/30/2021 1011   CHOLHDL 3.8 03/30/2021 1011   LDLCALC 81 03/30/2021 1011    Physical Exam:    VS:  BP 130/70 (BP Location: Left Arm, Patient Position: Sitting, Cuff Size: Normal)   Pulse (!) 104   Ht _0  (1.753 m)   Wt 179 lb (81.2 kg)   SpO2 94%   BMI 26.43 kg/m     Wt Readings from Last 3 Encounters:  10/13/21 179 lb (81.2 kg)  10/13/21 175 lb (79.4 kg)  03/30/21 175 lb 12.8 oz (79.7 kg)     GEN:  Well nourished, well developed in no acute distress HEENT: Normal NECK: No JVD; No carotid bruits LYMPHATICS: No lymphadenopathy CARDIAC: RRR, no murmurs, no rubs, no gallops RESPIRATORY:  Clear to auscultation without rales, wheezing or rhonchi  ABDOMEN: Soft, non-tender, non-distended MUSCULOSKELETAL:  No edema; No deformity  SKIN: Warm and dry LOWER EXTREMITIES: no swelling NEUROLOGIC:  Alert and oriented  x 3 PSYCHIATRIC:  Normal affect   ASSESSMENT:    1. Ischemic cardiomyopathy   2. Abnormal stress test   3. Chronic combined systolic and diastolic heart failure ([place])   4. Type 2 diabetes mellitus without complication, without long-term current use of insulin ([place])   5. Chronic kidney disease, stage II (mild)   6. Mixed hyperlipidemia    PLAN:    In order of problems listed above:  Abnormal stress test showing ischemia in LAD territory.  Patient also got significant cardiomyopathy.  He will be admitted to the hospital for future management.  He is EKG showed frequent PVCs, some  nonspecific ST segment changes, he need to be put on heparin antiplatelets therapy and cardiac catheterization can be considered Ischemic cardiomyopathy his nuclear stress test calculated ejection fraction of 18%, echocardiogram need to be done to recheck on that.  Of course appropriate guideline directed medical therapy need to be initiated.  He is already on carvedilol relatively small dose 6.25 which I will continue, he is also on lisinopril, we need to switch him to [facility]. Dyslipidemia.  He is on pravastatin 20 mg daily which need to be augmented as well probably Lipitor 80 will be appropriate choice. Diabetes mellitus.  Acute to be managed by hospitalist.  I had a long discussion with patient including all risk benefits as well as alternatives of different approaches to management of this problem in my opinion the best approach is to perform cardiac catheterization if kidney function is reasonable.  He is still debating about decision if he agree he will be transferred to [facility] for future evaluation.   Medication Adjustments/Labs and Tests Ordered: Current medicines are reviewed at length with the patient today.  Concerns regarding medicines are outlined above.  No orders of the defined types were placed in this encounter.  Medication changes: No orders of the defined types were placed in this  encounter.   Signed, Park Liter, MD, [facility] 10/13/2021 12:09 PM    [address]

## 2021-10-13 NOTE — ED Notes (Signed)
Fluids going at [facility] per Dr Hyacinth Meeker

## 2021-10-13 NOTE — Telephone Encounter (Signed)
Spoke with Vaughan Basta per pt request and advised that we have the patients car her at the office and his keys are here for someone to get his car. Advised that pt has gone to the ED at [facility] due to his stress test being positive.   Called the ED and notified them that the pts sister is aware and that she is coming to get his car.

## 2021-10-13 NOTE — ED Triage Notes (Signed)
Coming from heart care after having chest pain this am, was doing a stress test and had some abnormalities.  EMS gave 324mg  ASA and took nitro a couple hours ago and is pain free now.

## 2021-10-13 NOTE — ED Notes (Signed)
Secured chatted receiving RN messaged has been seen.

## 2021-10-13 NOTE — ED Notes (Signed)
Report given to  the nurse on 6e  pt alert no pain

## 2021-10-13 NOTE — Plan of Care (Signed)
  Problem: Education: Goal: Knowledge of General Education information will improve Description: Including pain rating scale, medication(s)/side effects and non-pharmacologic comfort measures Outcome: Progressing   Problem: Health Behavior/Discharge Planning: Goal: Ability to manage health-related needs will improve Outcome: Progressing   Problem: Clinical Measurements: Goal: Ability to maintain clinical measurements within normal limits will improve Outcome: Progressing   Problem: Clinical Measurements: Goal: Cardiovascular complication will be avoided Outcome: Progressing   Problem: Pain Managment: Goal: General experience of comfort will improve Outcome: Progressing   Problem: Safety: Goal: Ability to remain free from injury will improve Outcome: Progressing   

## 2021-10-13 NOTE — ED Provider Notes (Signed)
[facility] EMERGENCY DEPARTMENT Provider Note   CSN: [id number] Arrival date & time: 10/13/21  1301     History  Chief Complaint  Patient presents with   Chest Pain    Gedalia Shiels is a 76 y.o. male.   Chest Pain  This patient is a 76 year old male, he has a history significant for coronary disease though it was many years ago.  He had a cardiac catheterization which was done at that time that showed nonobstructive disease but has been known to have diabetes hypertension and chronic kidney failure.  The patient has been struggling with some angina and using nitro, he did not want to have an evaluation at that time but after recently being coaxed into it he agreed to have a stress test after discussing this with the pharmacist.  It is reported in the medical paperwork that accompanies the patient that "stress test is grossly abnormal".  He does have ischemia in the LAD territory with a fixed defect and some reversibility around it.  He was having prolonged episodes of chest pain after the Lexiscan and eventually had almost complete relief with nitroglycerin.  His ejection fraction on the Lexiscan was 18%.  Paramedics were called to bring the patient to the hospital for further evaluation, they activated a STEMI prehospital based on their interpretation of the EKG but this was quickly deactivated after cardiology viewed the encoded EKG which was not a STEMI  Home Medications Prior to Admission medications   Medication Sig Start Date End Date Taking? Authorizing Provider  aspirin EC 81 MG tablet Take 1 tablet (81 mg total) by mouth daily. 07/05/18  Yes Richardo Priest, MD  carvedilol (COREG) 6.25 MG tablet Take 1 tablet by mouth twice daily 04/27/21  Yes Munley, Hilton Cork, MD  furosemide (LASIX) 20 MG tablet Take 1 tablet by mouth once daily 04/27/21  Yes Munley, Hilton Cork, MD  glimepiride (AMARYL) 2 MG tablet Take 2 mg by mouth daily with breakfast.   Yes [provider]  lisinopril (ZESTRIL) 2.5 MG tablet Take 1 tablet by mouth once daily 04/27/21  Yes Munley, Hilton Cork, MD  nitroGLYCERIN (NITROSTAT) 0.4 MG SL tablet DISSOLVE ONE TABLET UNDER THE TONGUE EVERY 5 MINUTES AS NEEDED FOR CHEST PAIN.  DO NOT EXCEED A TOTAL OF 3 DOSES IN 15 MINUTES 07/21/21  Yes Richardo Priest, MD  omeprazole (PRILOSEC) 20 MG capsule Take 20 mg by mouth daily.   Yes [provider]  potassium chloride SA (KLOR-CON M) 20 MEQ tablet Take 1 tablet (20 mEq total) by mouth daily. 07/21/21  Yes Richardo Priest, MD  pravastatin (PRAVACHOL) 20 MG tablet Take 1 tablet (20 mg total) by mouth daily. 04/23/21  Yes Richardo Priest, MD  spironolactone (ALDACTONE) 25 MG tablet Take 1 tablet by mouth once daily 04/27/21  Yes Munley, Hilton Cork, MD      Allergies    Patient has no known allergies.    Review of Systems   Review of Systems  Cardiovascular:  Positive for chest pain.  All other systems reviewed and are negative.  Physical Exam Updated Vital Signs BP (!) 135/91   Pulse 97   Temp 98.3 F (36.8 C) (Oral)   Resp 16   Ht 1.753 m (5\' 9" )   Wt 81.2 kg   SpO2 98%   BMI 26.43 kg/m  Physical Exam Vitals and nursing note reviewed.  Constitutional:      General: He is not in  acute distress.    Appearance: He is well-developed.  HENT:     Head: Normocephalic and atraumatic.     Mouth/Throat:     Pharynx: No oropharyngeal exudate.  Eyes:     General: No scleral icterus.       Right eye: No discharge.        Left eye: No discharge.     Conjunctiva/sclera: Conjunctivae normal.     Pupils: Pupils are equal, round, and reactive to light.  Neck:     Thyroid: No thyromegaly.     Vascular: No JVD.  Cardiovascular:     Rate and Rhythm: Normal rate and regular rhythm.     Heart sounds: Normal heart sounds. No murmur heard.   No friction rub. No gallop.  Pulmonary:     Effort: Pulmonary effort is normal. No respiratory distress.     Breath sounds: Normal breath sounds. No  wheezing or rales.  Abdominal:     General: Bowel sounds are normal. There is no distension.     Palpations: Abdomen is soft. There is no mass.     Tenderness: There is no abdominal tenderness.  Musculoskeletal:        General: No tenderness. Normal range of motion.     Cervical back: Normal range of motion and neck supple.     Right lower leg: No tenderness.     Left lower leg: No tenderness.  Lymphadenopathy:     Cervical: No cervical adenopathy.  Skin:    General: Skin is warm and dry.     Findings: No erythema (.milm) or rash.  Neurological:     Mental Status: He is alert.     Coordination: Coordination normal.  Psychiatric:        Behavior: Behavior normal.    ED Results / Procedures / Treatments   Labs (all labs ordered are listed, but only abnormal results are displayed) Labs Reviewed  COMPREHENSIVE METABOLIC PANEL - Abnormal; Notable for the following components:      Result Value   Sodium 134 (*)    Glucose, Bld 118 (*)    Calcium 8.6 (*)    Total Protein 6.3 (*)    Albumin 3.3 (*)    All other components within normal limits  TROPONIN I (HIGH SENSITIVITY) - Abnormal; Notable for the following components:   Troponin I (High Sensitivity) 666 (*)    All other components within normal limits  CBC WITH DIFFERENTIAL/PLATELET  HEPARIN LEVEL (UNFRACTIONATED)  CBG MONITORING, ED    EKG None  Radiology DG Chest Portable 1 View  Result Date: 10/13/2021 CLINICAL DATA:  Chest pain. EXAM: PORTABLE CHEST 1 VIEW COMPARISON:  Prior chest radiographs 02/29/2012 and earlier. FINDINGS: Heart size within normal limits. Aortic atherosclerosis. No appreciable airspace consolidation or pulmonary edema. No evidence of pleural effusion or pneumothorax. No acute bony abnormality identified. IMPRESSION: No evidence of acute cardiopulmonary abnormality. Aortic Atherosclerosis (ICD10-I70.0). Electronically Signed   By: Kellie Simmering D.O.   On: 10/13/2021 13:27    Procedures .Critical  Care Performed by: Noemi Chapel, MD Authorized by: Noemi Chapel, MD   Critical care provider statement:    Critical care time (minutes):  45   Critical care was necessary to treat or prevent imminent or life-threatening deterioration of the following conditions:  Cardiac failure   Critical care was time spent personally by me on the following activities:  Development of treatment plan with patient or surrogate, discussions with consultants, evaluation of patient's response to treatment, examination  of patient, ordering and review of laboratory studies, ordering and review of radiographic studies, ordering and performing treatments and interventions, pulse oximetry, re-evaluation of patient's condition, review of old charts and obtaining history from patient or surrogate   I assumed direction of critical care for this patient from another provider in my specialty: no     Care discussed with: admitting provider   Comments:          Medications Ordered in ED Medications  aspirin chewable tablet 324 mg (324 mg Oral Not Given 10/13/21 1328)  0.9 %  sodium chloride infusion ( Intravenous New Bag/Given 10/13/21 1344)  heparin ADULT infusion 100 units/mL (25000 units/236mL) (1,000 Units/hr Intravenous New Bag/Given 10/13/21 1345)  heparin bolus via infusion 4,000 Units (4,000 Units Intravenous Bolus from Bag 10/13/21 1346)    ED Course/ Medical Decision Making/ A&P                           Medical Decision Making Amount and/or Complexity of Data Reviewed Labs: ordered. Radiology: ordered.  Risk OTC drugs. Prescription drug management. Decision regarding hospitalization.   This patient presents to the ED for concern of chest pain and ischemic findings on stress test, this involves an extensive number of treatment options, and is a complaint that carries with it a high risk of complications and morbidity.  The differential diagnosis includes acute ischemia, less likely to be  dissection   Co morbidities that complicate the patient evaluation  Chronic heart disease, hypertension, chronic kidney disease   Additional history obtained:  Additional history obtained from electronic medical record External records from outside source obtained and reviewed including stress test performed today, paramedic report and prehospital EKGs   Lab Tests:  I Ordered, and personally interpreted labs.  The pertinent results include: CBC metabolic panel and troponin, they show Trop > 600, c/w NSTEMI   Imaging Studies ordered:  I ordered imaging studies including portable chest x-ray I independently visualized and interpreted imaging which showed no acute findings I agree with the radiologist interpretation   Cardiac Monitoring: / EKG:  The patient was maintained on a cardiac monitor.  I personally viewed and interpreted the cardiac monitored which showed an underlying rhythm of: Sinus rhythm   Consultations Obtained:  I requested consultation with the cardiology,  and discussed lab and imaging findings as well as pertinent plan - they recommend: Admit to the hospital I discussed the case with cardiology on-call provider Trish, she recommends heparin and they will come to see the patient for admission.   Problem List / ED Course / Critical interventions / Medication management  Patient is critically ill with what appears to be unstable angina and a very abnormal stress test which will require admission cardiac eval with stabilizing interventions, heparin drip ordered I ordered medication including heparin drip for acute coronary ischemia Reevaluation of the patient after these medicines showed that the patient improved I have reviewed the patients home medicines and have made adjustments as needed   Social Determinants of Health:  Chronically ill   Test / Admission - Considered:  Will need admission to high level of care         Final Clinical  Impression(s) / ED Diagnoses Final diagnoses:  NSTEMI (non-ST elevated myocardial infarction) ([place])     Noemi Chapel, MD 10/13/21 1410

## 2021-10-13 NOTE — H&P (Addendum)
Cardiology Admission History and Physical:   Patient ID: Frank Swanson MRN: [id number]; DOB: 07/06/1945   Admission date: 10/13/2021  PCP:  Patient, No Pcp Per (Inactive)   [facility] Providers Cardiologist:  Shirlee More, MD      Chief Complaint:  Chest pain  Patient Profile:   Frank Swanson is a 76 y.o. male with HFrEF (EF 40 to 45%), CKD stage 2, hyperlipidemia, hypertension, diabetes who is being seen 10/13/2021 for the evaluation of chest pain/abnormal stress test.  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  He has been followed by Dr. Bettina Gavia as an outpatient.  He was seen recently by Dr. Bettina Gavia several months back and noted some stable angina but refused further ischemic evaluation at that time.  There were multiple phone calls from his pharmacist who was alarmed about the frequent refills on his sublingual nitroglycerin.  As a result he was scheduled to have an outpatient stress test. He presented to [facility] on 5/31 and underwent stress testing which was reported to be grossly abnormal with concern for ischemia in the LAD territory and distal portion of the anterior wall with a fixed defect though notable reversibility.  Also had a prolonged period of chest pain after test was completed and given aminophylline as well as nitroglycerin with relief of symptoms.  His EF was reported at 18% on Lexiscan.  He was evaluated by Dr. Agustin Cree with recommendations to be transferred to [facility] for further evaluation and likely cardiac catheterization.  Initially was reluctant but agreeable and EMS was called.  Code STEMI was initially called in the field but canceled on arrival.  In the ED labs showed sodium 134, potassium 3.5, creatinine 1.09, troponin 666, WBC 6.4, hemoglobin 13.8.  EKG showed sinus tachycardia, 99 bpm, frequent PVCs, nonspecific T wave changes.  Chest x-ray negative.  Evaluated in  the ED and pain-free.  He was started on IV heparin.  Cardiology asked to evaluate further for admission.   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 Authorizing Provider  aspirin EC 81 MG tablet Take 1 tablet (81 mg total) by mouth daily. 07/05/18   Richardo Priest, MD  carvedilol (COREG) 6.25 MG tablet Take 1 tablet by mouth twice daily 04/27/21   Richardo Priest, MD  furosemide (LASIX) 20 MG tablet Take 1 tablet by mouth once daily 04/27/21   Richardo Priest, MD  glimepiride (AMARYL) 2 MG tablet Take 2 mg by mouth daily with breakfast.    [provider]  lisinopril (ZESTRIL) 2.5 MG tablet Take 1 tablet by mouth once daily 04/27/21   Richardo Priest, MD  nitroGLYCERIN (NITROSTAT) 0.4 MG SL tablet DISSOLVE ONE TABLET UNDER  THE TONGUE EVERY 5 MINUTES AS NEEDED FOR CHEST PAIN.  DO NOT EXCEED A TOTAL OF 3 DOSES IN 15 MINUTES 07/21/21   Richardo Priest, MD  omeprazole (PRILOSEC) 20 MG capsule Take 20 mg by mouth daily.    [provider]  potassium chloride SA (KLOR-CON M) 20 MEQ tablet Take 1 tablet (20 mEq total) by mouth daily. 07/21/21   Richardo Priest, MD  pravastatin (PRAVACHOL) 20 MG tablet Take 1 tablet (20 mg total) by mouth daily. 04/23/21   Richardo Priest, MD  spironolactone (ALDACTONE) 25 MG tablet Take 1 tablet by mouth once daily 04/27/21   Richardo Priest, MD     Allergies:   No Known Allergies  Social History:   Social History   Socioeconomic History   Marital status: Unknown    Spouse name: Not on file   Number of children: Not on file   Years of education: Not on file   Highest education level: Not on file  Occupational History   Not on file  Tobacco Use   Smoking status: Every Day    Types: Cigarettes   Smokeless tobacco: Never  Vaping Use   Vaping Use: Never used  Substance and Sexual Activity   Alcohol use: No   Drug use: No   Sexual activity: Not on file  Other Topics Concern   Not on file  Social History Narrative   Not on file   Social Determinants of Health   Financial Resource Strain: Not on file  Food Insecurity: Not on file  Transportation Needs: Not on file  Physical Activity: Not on file  Stress: Not on file  Social Connections: Not on file  Intimate Partner Violence: Not on file    Family History:   The patient's family history includes Cancer in his father; Heart failure in his mother.    ROS:  Please see the history of present illness.  All other ROS reviewed and negative.     Physical Exam/Data:   Vitals:   10/13/21 1309 10/13/21 1315 10/13/21 1345 10/13/21 1415  BP: 126/82 140/82 (!) 135/91 (!) 145/83  Pulse: (!) 102 98 97 99  Resp: (!) 24 20 16 16   Temp: 98.3 F (36.8 C)     TempSrc: Oral     SpO2: 99% 100% 98% 98%  Weight:      Height:       No intake or output data in the 24 hours ending 10/13/21 1432    10/13/2021    1:06 PM 10/13/2021   11:45 AM 10/13/2021    8:13 AM  Last 3 Weights  Weight (lbs) 179 lb 179 lb 175 lb  Weight (kg) 81.194 kg 81.194 kg 79.379 kg     Body mass index is 26.43 kg/m.  General:  Well nourished, well developed, in no acute distress HEENT: normal Neck: no JVD Vascular: No carotid bruits; Distal pulses 2+ bilaterally   Cardiac:  normal S1,  S2; RRR; no murmur  Lungs:  clear to auscultation bilaterally, no wheezing, rhonchi or rales  Abd: soft, nontender, no hepatomegaly  Ext: no edema Musculoskeletal:  No deformities, BUE and BLE strength normal and equal Skin: warm and dry  Neuro:  CNs 2-12 intact, no focal abnormalities noted Psych:  Normal affect    EKG:  The ECG that was done 5/31 was personally reviewed and demonstrates sinus tachycardia, 99 bpm, frequent PVCs, nonspecific T wave changes.  Relevant CV Studies:  [place]  [facility]  Data:  High Sensitivity Troponin:   Recent Labs  Lab 10/13/21 1315  TROPONINIHS 666*      Chemistry Recent Labs  Lab 10/13/21 1315  NA 134*  K 3.5  CL 104  CO2 22  GLUCOSE 118*  BUN 12  CREATININE 1.09  CALCIUM 8.6*  GFRNONAA >60  ANIONGAP 8    Recent Labs  Lab 10/13/21 1315  PROT 6.3*  ALBUMIN 3.3*  AST 27  ALT 15  ALKPHOS 54  BILITOT 1.1   Lipids No results for input(s): CHOL, TRIG, HDL, LABVLDL, LDLCALC, CHOLHDL in the last 168 hours. Hematology Recent Labs  Lab 10/13/21 1315  WBC 6.4  RBC 4.51  HGB 13.8  HCT 40.1  MCV 88.9  MCH 30.6  MCHC 34.4  RDW 11.9  PLT 189   Thyroid No results for input(s): TSH, FREET4 in the last 168 hours. BNPNo results for input(s): BNP, PROBNP in the last 168 hours.  DDimer No results for input(s): DDIMER in the last 168 hours.   Radiology/Studies:  DG Chest Portable 1 View  Result Date: 10/13/2021 CLINICAL DATA:  Chest pain. EXAM: PORTABLE CHEST 1 VIEW COMPARISON:  Prior chest radiographs 02/29/2012 and earlier. FINDINGS: Heart size within normal limits. Aortic atherosclerosis. No appreciable airspace consolidation or pulmonary edema. No evidence of pleural effusion or pneumothorax. No acute bony abnormality identified. IMPRESSION: No evidence of acute cardiopulmonary abnormality. Aortic Atherosclerosis (ICD10-I70.0). Electronically Signed   By: Kellie Simmering D.O.   On: 10/13/2021 13:27     Assessment  and Plan:   Heriberto Mole is a 76 y.o. male with HFrEF (EF 40 to 45%), CKD stage 2, hyperlipidemia, hypertension, diabetes who is being seen 10/13/2021 for the evaluation of chest pain/abnormal stress test.  Chest pain/abnormal stress test/NSTEMI: Underwent outpatient Lexiscan Myoview today which was abnormal with concern for ischemia within the LAD territory.  Patient also had significant chest discomfort during and after testing requiring aminophylline and nitroglycerin.  Currently pain-free in the ED.  Initial troponin 666, started on IV heparin per EDP. -- Continue IV heparin, aspirin, statin, Coreg, lisinopril and Spiro -- Check echo -- Plan for cardiac catheterization tomorrow  Shared Decision Making/Informed Consent The risks [stroke (1 in 1000), death (1 in 1000), kidney failure [usually temporary] (1 in 500), bleeding (1 in 200), allergic reaction [possibly serious] (1 in 200)], benefits (diagnostic support and management of coronary artery disease) and alternatives of a cardiac catheterization were discussed in detail with Mr. Gilchrest and he is willing to proceed.   HFrEF: Known LVEF of 40-45%, lexiscan myoview reported EF of 18% today. No volume overload noted on exam  -- check echo -- continue coreg, lisinopril, spiro  HTN: mildly elevated in the ED -- continue coreg, lisinopril, spiro  HLD: has been on pravastatin -- check lipids -- switch to atorvastatin 80mg  daily   DM: hold home glipizide -- SSI while inpatient    Risk Assessment/Risk Scores:    TIMI Risk Score for Unstable Angina or Non-ST Elevation MI:   The patient's TIMI risk score is 3, which indicates a 13% risk of all cause mortality, new or recurrent myocardial infarction or need for urgent revascularization in the next 14 days.  Severity of Illness: The appropriate patient status for this patient is OBSERVATION. Observation status is judged to be reasonable and necessary in order to provide the required  intensity of service to ensure the patient's safety. The patient's presenting symptoms, physical exam findings, and initial radiographic and laboratory data in the  context of their medical condition is felt to place them at decreased risk for further clinical deterioration. Furthermore, it is anticipated that the patient will be medically stable for discharge from the hospital within 2 midnights of admission.    For questions or updates, please contact [place] Please consult [email or web address] for contact info under     Signed, Reino Bellis, NP  10/13/2021 2:32 PM    ATTENDING ATTESTATION:  After conducting a review of all available clinical information with the care team, interviewing the patient, and performing a physical exam, I agree with the findings and plan described in this note.   GEN: No acute distress.   HEENT:  MMM, no JVD, no scleral icterus Cardiac: RRR, no murmurs, rubs, or gallops.  Respiratory: Clear to auscultation bilaterally. GI: Soft, nontender, non-distended  MS: No edema; No deformity. Neuro:  Nonfocal  Vasc:  +2 radial pulses  The patient is a 76 year old male with history of type 2 diabetes not on insulin, chronic kidney disease, and hyperlipidemia who was referred for stress test by Dr. Bettina Gavia.  The patient tells me he has had stable anginal symptoms for some time.  They reliably resolved with rest.  He has not been taking his as needed nitroglycerin.  He has been on carvedilol however.  He was referred for a stress test which demonstrated anterior wall ischemia and an ejection fraction of 18% consistent with high risk findings.  Because of this he was transferred to the emergency department at [facility].  His EKG here demonstrates sinus rhythm with anterior infarction pattern.  Here he is resting comfortably and without angina.  We will obtain an echocardiogram to assess his ejection fraction and for any valvular abnormalities though no murmurs were  auscultated on exam.  We will then refer him for coronary angiography tomorrow given his high risk stress findings.  Further recommendations will be informed by the results of his angiography study and echocardiogram.  Given his elevated troponin we will start heparin, beta-blocker, aspirin, and statin.  Given history of diabetes we will start Jardiance.  Lenna Sciara, MD Pager [phone]

## 2021-10-14 ENCOUNTER — Observation Stay (HOSPITAL_COMMUNITY): Payer: Medicare Other

## 2021-10-14 ENCOUNTER — Encounter (HOSPITAL_COMMUNITY): Payer: Medicare Other

## 2021-10-14 ENCOUNTER — Encounter (HOSPITAL_COMMUNITY): Payer: Self-pay | Admitting: Cardiology

## 2021-10-14 ENCOUNTER — Other Ambulatory Visit (HOSPITAL_COMMUNITY): Payer: Self-pay

## 2021-10-14 ENCOUNTER — Encounter (HOSPITAL_COMMUNITY)
Admission: EM | Disposition: A | Discharge status: Home / Self Care | Payer: Self-pay | Source: Home / Self Care | Attending: Internal Medicine

## 2021-10-14 DIAGNOSIS — I3139 Other pericardial effusion (noninflammatory): Secondary | ICD-10-CM | POA: Diagnosis present

## 2021-10-14 DIAGNOSIS — I493 Ventricular premature depolarization: Secondary | ICD-10-CM | POA: Diagnosis present

## 2021-10-14 DIAGNOSIS — F17210 Nicotine dependence, cigarettes, uncomplicated: Secondary | ICD-10-CM | POA: Diagnosis present

## 2021-10-14 DIAGNOSIS — I5042 Chronic combined systolic (congestive) and diastolic (congestive) heart failure: Secondary | ICD-10-CM | POA: Diagnosis present

## 2021-10-14 DIAGNOSIS — I25110 Atherosclerotic heart disease of native coronary artery with unstable angina pectoris: Secondary | ICD-10-CM | POA: Diagnosis not present

## 2021-10-14 DIAGNOSIS — Z7982 Long term (current) use of aspirin: Secondary | ICD-10-CM | POA: Diagnosis not present

## 2021-10-14 DIAGNOSIS — E1122 Type 2 diabetes mellitus with diabetic chronic kidney disease: Secondary | ICD-10-CM | POA: Diagnosis present

## 2021-10-14 DIAGNOSIS — I2510 Atherosclerotic heart disease of native coronary artery without angina pectoris: Secondary | ICD-10-CM | POA: Diagnosis present

## 2021-10-14 DIAGNOSIS — Z8249 Family history of ischemic heart disease and other diseases of the circulatory system: Secondary | ICD-10-CM | POA: Diagnosis not present

## 2021-10-14 DIAGNOSIS — N182 Chronic kidney disease, stage 2 (mild): Secondary | ICD-10-CM | POA: Diagnosis present

## 2021-10-14 DIAGNOSIS — I214 Non-ST elevation (NSTEMI) myocardial infarction: Secondary | ICD-10-CM | POA: Diagnosis present

## 2021-10-14 DIAGNOSIS — I255 Ischemic cardiomyopathy: Secondary | ICD-10-CM | POA: Diagnosis present

## 2021-10-14 DIAGNOSIS — I130 Hypertensive heart and chronic kidney disease with heart failure and stage 1 through stage 4 chronic kidney disease, or unspecified chronic kidney disease: Secondary | ICD-10-CM | POA: Diagnosis present

## 2021-10-14 DIAGNOSIS — R079 Chest pain, unspecified: Secondary | ICD-10-CM | POA: Diagnosis present

## 2021-10-14 DIAGNOSIS — E785 Hyperlipidemia, unspecified: Secondary | ICD-10-CM | POA: Diagnosis present

## 2021-10-14 DIAGNOSIS — Z79899 Other long term (current) drug therapy: Secondary | ICD-10-CM | POA: Diagnosis not present

## 2021-10-14 HISTORY — PX: LEFT HEART CATH AND CORONARY ANGIOGRAPHY: CATH118249

## 2021-10-14 LAB — GLUCOSE, CAPILLARY
Glucose-Capillary: 90 mg/dL (ref 70–99)
Glucose-Capillary: 79 mg/dL (ref 70–99)
Glucose-Capillary: 104 mg/dL — ABNORMAL HIGH (ref 70–99)
Glucose-Capillary: 95 mg/dL (ref 70–99)

## 2021-10-14 LAB — LIPID PANEL
Cholesterol: 140 mg/dL (ref 0–200)
HDL: 33 mg/dL — ABNORMAL LOW (ref >40)
LDL Cholesterol: 94 mg/dL (ref 0–99)
Total CHOL/HDL Ratio: 4.2 RATIO
Triglycerides: 65 mg/dL (ref <150)
VLDL: 13 mg/dL (ref 0–40)

## 2021-10-14 LAB — CBC
HCT: 38.9 % — ABNORMAL LOW (ref 39.0–52.0)
Hemoglobin: 13.7 g/dL (ref 13.0–17.0)
MCH: 30.9 pg (ref 26.0–34.0)
MCHC: 35.2 g/dL (ref 30.0–36.0)
MCV: 87.6 fL (ref 80.0–100.0)
Platelets: 175 10*3/uL (ref 150–400)
RBC: 4.44 MIL/uL (ref 4.22–5.81)
RDW: 11.9 % (ref 11.5–15.5)
WBC: 6.1 10*3/uL (ref 4.0–10.5)
nRBC: 0 % (ref 0.0–0.2)

## 2021-10-14 LAB — MYOCARDIAL PERFUSION IMAGING
LV dias vol: 240 mL (ref 62–150)
LV sys vol: 198 mL
Nuc Stress EF: 18 %
Peak HR: 109 {beats}/min
Rest HR: 83 {beats}/min
Rest Nuclear Isotope Dose: 10 mCi
SDS: 15
SRS: 17
SSS: 32
ST Depression (mm): 0 mm
Stress Nuclear Isotope Dose: 30.5 mCi
TID: 1.17

## 2021-10-14 LAB — ECHOCARDIOGRAM COMPLETE
AR max vel: 3.99 cm2
AV Area VTI: 3.94 cm2
AV Area mean vel: 3.66 cm2
AV Mean grad: 3 mmHg
AV Peak grad: 4.7 mmHg
Ao pk vel: 1.08 m/s
Area-P 1/2: 5.22 cm2
Height: 69 in
MV M vel: 4.62 m/s
MV Peak grad: 85.2 mmHg
S' Lateral: 5.8 cm
Single Plane A4C EF: 24.8 %
Weight: 2860.8 oz

## 2021-10-14 LAB — BASIC METABOLIC PANEL
Anion gap: 9 (ref 5–15)
BUN: 14 mg/dL (ref 8–23)
CO2: 21 mmol/L — ABNORMAL LOW (ref 22–32)
Calcium: 8.6 mg/dL — ABNORMAL LOW (ref 8.9–10.3)
Chloride: 107 mmol/L (ref 98–111)
Creatinine, Ser: 1.22 mg/dL (ref 0.61–1.24)
GFR, Estimated: >60 mL/min (ref >60)
Glucose, Bld: 82 mg/dL (ref 70–99)
Potassium: 3.8 mmol/L (ref 3.5–5.1)
Sodium: 137 mmol/L (ref 135–145)

## 2021-10-14 LAB — HEPARIN LEVEL (UNFRACTIONATED): Heparin Unfractionated: 0.27 IU/mL — ABNORMAL LOW (ref 0.30–0.70)

## 2021-10-14 SURGERY — LEFT HEART CATH AND CORONARY ANGIOGRAPHY
Anesthesia: LOCAL

## 2021-10-14 MED ORDER — SODIUM CHLORIDE 0.9% FLUSH
3.0000 mL | Freq: Two times a day (BID) | INTRAVENOUS | Status: DC
  Administered 2021-10-14: 3 mL via INTRAVENOUS

## 2021-10-14 MED ORDER — LOSARTAN POTASSIUM 25 MG PO TABS
25.0000 mg | ORAL_TABLET | Freq: Every day | ORAL | Status: DC
  Administered 2021-10-14: 25 mg via ORAL
  Filled 2021-10-14: qty 1

## 2021-10-14 MED ORDER — IOHEXOL 350 MG/ML SOLN
INTRAVENOUS | Status: DC | PRN
  Administered 2021-10-14: 55 mL

## 2021-10-14 MED ORDER — HEPARIN (PORCINE) IN NACL 1000-0.9 UT/500ML-% IV SOLN
INTRAVENOUS | Status: DC | PRN
  Administered 2021-10-14 (×2): 500 mL

## 2021-10-14 MED ORDER — HEPARIN (PORCINE) IN NACL 1000-0.9 UT/500ML-% IV SOLN
INTRAVENOUS | Status: AC
  Filled 2021-10-14: qty 1000

## 2021-10-14 MED ORDER — SODIUM CHLORIDE 0.9% FLUSH: 3.0000 mL | INTRAVENOUS | Status: DC | PRN

## 2021-10-14 MED ORDER — SODIUM CHLORIDE 0.9 % WEIGHT BASED INFUSION: 1.0000 mL/kg/h | INTRAVENOUS | Status: AC

## 2021-10-14 MED ORDER — VERAPAMIL HCL 2.5 MG/ML IV SOLN
INTRAVENOUS | Status: AC
  Filled 2021-10-14: qty 2

## 2021-10-14 MED ORDER — MIDAZOLAM HCL 2 MG/2ML IJ SOLN
INTRAMUSCULAR | Status: AC
  Filled 2021-10-14: qty 2

## 2021-10-14 MED ORDER — SODIUM CHLORIDE 0.9 % IV SOLN: INTRAVENOUS | Status: DC

## 2021-10-14 MED ORDER — PERFLUTREN LIPID MICROSPHERE
1.0000 mL | INTRAVENOUS | Status: AC | PRN
  Administered 2021-10-14: 2 mL via INTRAVENOUS

## 2021-10-14 MED ORDER — MIDAZOLAM HCL 2 MG/2ML IJ SOLN
INTRAMUSCULAR | Status: DC | PRN
  Administered 2021-10-14: 1 mg via INTRAVENOUS

## 2021-10-14 MED ORDER — LIDOCAINE HCL (PF) 1 % IJ SOLN
INTRAMUSCULAR | Status: DC | PRN
  Administered 2021-10-14: 2 mL

## 2021-10-14 MED ORDER — HEPARIN SODIUM (PORCINE) 1000 UNIT/ML IJ SOLN
INTRAMUSCULAR | Status: DC | PRN
  Administered 2021-10-14: 4000 [IU] via INTRAVENOUS

## 2021-10-14 MED ORDER — CARVEDILOL 25 MG PO TABS
25.0000 mg | ORAL_TABLET | Freq: Two times a day (BID) | ORAL | Status: DC
  Administered 2021-10-14 – 2021-10-15 (×2): 25 mg via ORAL
  Filled 2021-10-14 (×2): qty 1

## 2021-10-14 MED ORDER — HEPARIN SODIUM (PORCINE) 1000 UNIT/ML IJ SOLN
INTRAMUSCULAR | Status: AC
  Filled 2021-10-14: qty 10

## 2021-10-14 MED ORDER — HEPARIN (PORCINE) 25000 UT/250ML-% IV SOLN
1200.0000 [IU]/h | INTRAVENOUS | Status: DC
  Administered 2021-10-14: 1200 [IU]/h via INTRAVENOUS

## 2021-10-14 MED ORDER — LIDOCAINE HCL (PF) 1 % IJ SOLN
INTRAMUSCULAR | Status: AC
  Filled 2021-10-14: qty 30

## 2021-10-14 MED ORDER — SODIUM CHLORIDE 0.9 % IV SOLN: 250.0000 mL | INTRAVENOUS | Status: DC | PRN

## 2021-10-14 MED ORDER — FENTANYL CITRATE (PF) 100 MCG/2ML IJ SOLN
INTRAMUSCULAR | Status: AC
  Filled 2021-10-14: qty 2

## 2021-10-14 MED ORDER — SPIRONOLACTONE 25 MG PO TABS: 25.0000 mg | ORAL_TABLET | Freq: Every day | ORAL | Status: DC

## 2021-10-14 MED ORDER — VERAPAMIL HCL 2.5 MG/ML IV SOLN: INTRAVENOUS | Status: DC | PRN

## 2021-10-14 MED ORDER — FENTANYL CITRATE (PF) 100 MCG/2ML IJ SOLN
INTRAMUSCULAR | Status: DC | PRN
Start: 2021-10-14 — End: 2021-10-14
  Administered 2021-10-14: 25 ug via INTRAVENOUS

## 2021-10-14 SURGICAL SUPPLY — 11 items
BAND ZEPHYR COMPRESS 30 LONG (HEMOSTASIS) ×1 IMPLANT
CATH 5FR JL3.5 JR4 ANG PIG MP (CATHETERS) ×1 IMPLANT
GLIDESHEATH SLEND SS 6F .021 (SHEATH) ×1 IMPLANT
GUIDEWIRE INQWIRE 1.5J.035X260 (WIRE) IMPLANT
INQWIRE 1.5J .035X260CM (WIRE) ×2
KIT HEART LEFT (KITS) ×2 IMPLANT
PACK CARDIAC CATHETERIZATION (CUSTOM PROCEDURE TRAY) ×2 IMPLANT
SYR MEDRAD MARK 7 150ML (SYRINGE) ×2 IMPLANT
TRANSDUCER W/STOPCOCK (MISCELLANEOUS) ×2 IMPLANT
TUBING CIL FLEX 10 FLL-RA (TUBING) ×2 IMPLANT
WIRE HI TORQ VERSACORE-J 145CM (WIRE) ×1 IMPLANT

## 2021-10-14 NOTE — Consult Note (Addendum)
[address]       [place] [address]             [phone]        Macai Beier [place] Medical Record [id number] Date of Birth: Feb 24, 1946  Referring: Frank Swanson Primary Care: Patient, No Pcp Per (Inactive) Primary Cardiologist:Brian Bettina Gavia, MD  Chief Complaint:    Chief Complaint  Patient presents with   Chest Pain   History of Present Illness:      Frank Swanson is a 76 yo male with history or, HTN, CKD, Chronic combined systolic/diastolic heart failure, Type 2 DM, and Dyslipidemia.  The patient has known history of CAD.  He has been followed by Dr. Bettina Gavia with most recent visit in November of last year.  At that time the patient continued to complain of episodic chest discomfort that occurred with and without activity.  The pain was relieved with belching and NTG.  He denied shortness of breath, LE edema, palpitations at that time.  The patient refused ischemic evaluation at that time.  The cardiology office was notified by the pharmacy in regards to the patient having refilled his NTG multiple times.  He was agreeable to stress test which was performed and was abnormal.  His EF was 18% and there was evidence of ischemia.  The patient developed chest pain post procedure and was sent to the ED for further evaluation.  The patient had taken ASA and NTG prior to arrival with relief of symptoms.  EKG was obtained and showed no evidence of ST changes, he was tachycardic with PVCs, and T wave changes.  He was started on IV Heparin and Cardiology consult was obtained.  Troponin levels were elevated and he was ruled in for NSTEMI and admitted for further care.  Cardiac catheterization was performed and showed severe 3 vessel CAD.  It was felt coronary bypass grafting would be indicated and TCTS consult was requested.  The patient is currently having chest pain.  He is a smoker of 1 ppd for 65 years.  He states his mother died in her 91s from CHF.  He lives alone and is active  in his yard and can complete his daily activities.  The patient does not wish to have heart surgery.  He would prefer stenting, but his if this not an option he is will to have medical therapy.    Current Activity/ Functional Status: Patient is independent with mobility/ambulation, transfers, ADL's, IADL's.   Zubrod Score: At the time of surgery this patient's most appropriate activity status/level should be described as: []     0    Normal activity, no symptoms [x]     1    Restricted in physical strenuous activity but ambulatory, able to do out light work []     2    Ambulatory and capable of self care, unable to do work activities, up and about                 more than 50%  Of the time                            []     3    Only limited self care, in bed greater than 50% of waking hours []     4    Completely disabled, no self care, confined to bed or chair []     5    Moribund  Past  Medical History:  Diagnosis Date   Cardiomyopathy ([facility]) 05/06/2015   Chronic combined systolic and diastolic heart failure ([facility]) 05/06/2015   Chronic kidney disease, stage II (mild) [id number]   Chronic systolic heart failure ([facility]) 05/06/2015   CKD (chronic kidney disease) 05/06/2015   High risk medication use 05/06/2015   Overview:  Digoxin   Hyperlipidemia 05/06/2015   Hypertensive heart disease with heart failure ([facility]) 05/02/2017   Mild CAD 05/06/2015   Type 2 diabetes mellitus without complication, without long-term current use of insulin ([facility]) 03/17/2017    Past Surgical History:  Procedure Laterality Date   CARDIAC CATHETERIZATION     LEFT HEART CATH AND CORONARY ANGIOGRAPHY N/A 10/14/2021   Procedure: LEFT HEART CATH AND CORONARY ANGIOGRAPHY;  Surgeon: Frank Swanson, Kealani Leckey M, MD;  Location: [facility];  Service: Cardiovascular;  Laterality: N/A;    Social History   Tobacco Use  Smoking Status Every Day   Types: Cigarettes  Smokeless Tobacco Never    Social History   Substance and Sexual  Activity  Alcohol Use No   No Known Allergies  Current Facility-Administered Medications  Medication Dose Route Frequency Provider Last Rate Last Admin   0.9 %  sodium chloride infusion   Intravenous Continuous Frank Swanson, Mariona Scholes M, MD 50 mL/hr at 10/14/21 0600 Infusion Verify at 10/14/21 0600   0.9 %  sodium chloride infusion  250 mL Intravenous PRN Frank Swanson, Jorden Mahl M, MD       0.9% sodium chloride infusion  1 mL/kg/hr Intravenous Continuous Frank Swanson, Tamicka Shimon M, MD       acetaminophen (TYLENOL) tablet 650 mg  650 mg Oral Q4H PRN Frank Swanson, Chief Walkup M, MD       aspirin chewable tablet 324 mg  324 mg Oral Once Frank Swanson, Marcela Alatorre M, MD       aspirin EC tablet 81 mg  81 mg Oral Daily Frank Swanson, Treina Arscott M, MD       atorvastatin (LIPITOR) tablet 80 mg  80 mg Oral Daily Frank Swanson, Sergey Ishler M, MD   80 mg at 10/14/21 0844   carvedilol (COREG) tablet 25 mg  25 mg Oral BID Frank Swanson, Jovonni Borquez M, MD       empagliflozin (JARDIANCE) tablet 10 mg  10 mg Oral Daily Frank Swanson, Saphia Vanderford M, MD   10 mg at 10/14/21 0844   insulin aspart (novoLOG) injection 0-9 Units  0-9 Units Subcutaneous TID WC Frank Swanson, Besan Ketchem M, MD       losartan (COZAAR) tablet 25 mg  25 mg Oral QHS Frank Swanson, Navid Lenzen M, MD       nitroGLYCERIN (NITROSTAT) SL tablet 0.4 mg  0.4 mg Sublingual Q5 Min x 3 PRN Frank Swanson, Kamrin Spath M, MD       ondansetron [facility]) injection 4 mg  4 mg Intravenous Q6H PRN Frank Swanson, Chandra Feger M, MD       sodium chloride flush (NS) 0.9 % injection 3 mL  3 mL Intravenous Q12H Frank Swanson, Nareh Matzke M, MD   3 mL at 10/13/21 2200   sodium chloride flush (NS) 0.9 % injection 3 mL  3 mL Intravenous Q12H Frank Swanson, Caden Fatica M, MD       sodium chloride flush (NS) 0.9 % injection 3 mL  3 mL Intravenous PRN Frank Swanson, Aeneas Longsworth M, MD       [START ON 10/15/2021] spironolactone (ALDACTONE) tablet 25 mg  25 mg Oral q1800 Frank Swanson, Ladislav Caselli M, MD        Medications Prior to Admission  Medication Sig Dispense Refill Last Dose   aspirin EC 81  MG tablet Take 1 tablet (81 mg total) by mouth daily. 90 tablet 3 Past Week    carvedilol (COREG) 6.25 MG tablet Take 1 tablet by mouth twice daily 180 tablet 0 unknown   furosemide (LASIX) 20 MG tablet Take 1 tablet by mouth once daily 90 tablet 0 unknown   glimepiride (AMARYL) 2 MG tablet Take 2 mg by mouth daily with breakfast.   unknown   lisinopril (ZESTRIL) 2.5 MG tablet Take 1 tablet by mouth once daily 90 tablet 0 unknown   nitroGLYCERIN (NITROSTAT) 0.4 MG SL tablet DISSOLVE ONE TABLET UNDER THE TONGUE EVERY 5 MINUTES AS NEEDED FOR CHEST PAIN.  DO NOT EXCEED A TOTAL OF 3 DOSES IN 15 MINUTES 25 tablet 7 unknown   omeprazole (PRILOSEC) 20 MG capsule Take 20 mg by mouth daily.   unknown   potassium chloride SA (KLOR-CON M) 20 MEQ tablet Take 1 tablet (20 mEq total) by mouth daily. 90 tablet 1 unknown   pravastatin (PRAVACHOL) 20 MG tablet Take 1 tablet (20 mg total) by mouth daily. 90 tablet 2 unknown   spironolactone (ALDACTONE) 25 MG tablet Take 1 tablet by mouth once daily 90 tablet 0 unknown    Family History  Problem Relation Age of Onset   Heart failure Mother    Cancer Father      Review of Systems:   ROS    Cardiac Review of Systems: Y or  [    ]= no  Chest Pain [ Y   ]  Resting SOB [ N  ] Exertional SOB  [  ]  Orthopnea [  ]   Pedal Edema Aqua.Slicker   ]    Palpitations [ N ] Syncope  Aqua.Slicker  ]   Presyncope [   ]  General Review of Systems: [Y] = yes [  ]=no Constitional: recent weight change [  ]; anorexia [  ]; fatigue [ N ]; nausea Aqua.Slicker  ]; night sweats [  ]; fever [  ]; or chills [  ]                                                               Dental: Last Dentist visit: unknown  Eye : blurred vision [  ]; diplopia [   ]; vision changes [  ];  Amaurosis fugax[  ]; Resp: cough Aqua.Slicker  ];  wheezing[  ];  hemoptysis[  ]; shortness of breath[  ]; paroxysmal nocturnal dyspnea[  ]; dyspnea on exertion[  ]; or orthopnea[  ];  GI:  gallstones[  ], vomiting[  N];  dysphagia[  ]; melena[  ];  hematochezia [  ]; heartburn[  ];   Hx of  Colonoscopy[  ]; GU: kidney stones [   ]; hematuria[  ];   dysuria [  ];  nocturia[  ];  history of     obstruction [  ]; urinary frequency [  ]             Skin: rash, swelling[ N];, hair loss[  ];  peripheral edema[ N ];  or itching[  ]; + spider veins bilaterally Musculosketetal: myalgias[  ];  joint swelling[  ];  joint erythema[  ];  joint pain[  ];  back pain[  ];  Heme/Lymph: bruising[  ];  bleeding[  ];  anemia[  ];  Neuro: TIA[ N ];  headaches[  ];  stroke[ N ];  vertigo[  ];  seizures[  ];   paresthesias[  ];  difficulty walking[ N ];  Psych:depression[  ]; anxiety[  ];  Endocrine: diabetes[ Y ];  thyroid dysfunction[N  ];  Physical Exam: BP 107/76 (BP Location: Left Arm)   Pulse 70   Temp (!) 97.3 F (36.3 C) (Oral)   Resp 17   Ht 5\' 9"  (1.753 m)   Wt 81.1 kg   SpO2 94%   BMI 26.40 kg/m   General appearance: alert, cooperative, and no distress Head: Normocephalic, without obvious abnormality, atraumatic Neck: no adenopathy, no carotid bruit, no JVD, supple, symmetrical, trachea midline, and thyroid not enlarged, symmetric, no tenderness/mass/nodules Resp: clear to auscultation bilaterally Cardio: regular rate and rhythm GI: soft, non-tender; bowel sounds normal; no masses,  no organomegaly Extremities: varicose veins noted Neurologic: Grossly normal  Diagnostic Studies & Laboratory data:     Recent Radiology Findings:   CARDIAC CATHETERIZATION  Result Date: 10/14/2021   Ost LM to Mid LM lesion is 40% stenosed.   Prox LAD lesion is 90% stenosed.   Ost Cx to Prox Cx lesion is 80% stenosed.   1st Mrg lesion is 60% stenosed.   Mid Cx to Dist Cx lesion is 75% stenosed.   Prox RCA to Mid RCA lesion is 100% stenosed.   LV end diastolic pressure is normal. Severe 3 vessel obstructive CAD Severely calcified coronary arteries. Low LV EDP 5 mm Hg Plan: need CT surgery consultation for CABG.  DG Chest Portable 1 View  Result Date: 10/13/2021 CLINICAL DATA:  Chest pain. EXAM: PORTABLE CHEST 1 VIEW COMPARISON:  Prior  chest radiographs 02/29/2012 and earlier. FINDINGS: Heart size within normal limits. Aortic atherosclerosis. No appreciable airspace consolidation or pulmonary edema. No evidence of pleural effusion or pneumothorax. No acute bony abnormality identified. IMPRESSION: No evidence of acute cardiopulmonary abnormality. Aortic Atherosclerosis (ICD10-I70.0). Electronically Signed   By: Kellie Simmering D.O.   On: 10/13/2021 13:27   MYOCARDIAL PERFUSION IMAGING  Result Date: 10/14/2021   Findings are consistent with ischemia in LAD territory. The study is high risk.   No ST deviation was noted.   Left ventricular function is abnormal. Global function is severely reduced. Nuclear stress EF: 18 %. The left ventricular ejection fraction is severely decreased (<30%). End diastolic cavity size is moderately enlarged.   Prior study not available for comparison.   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 Ischemia involving apical, mid and basal portion of atnerior and antero-septal wall. Pt developed prolonged episode of CP after stress test and was transported to the ER.   ECHOCARDIOGRAM COMPLETE  Result Date: 10/14/2021    ECHOCARDIOGRAM REPORT   Patient Name:   Frank Swanson Date of Exam: 10/14/2021 Medical Rec #:  [id number]    Height:       69.0 in Accession #:    [id number]   Weight:       178.8 lb Date of Birth:  11/12/1945    BSA:          1.970 m Patient Age:    59 years     BP:           108/64 mmHg Patient Gender: M            HR:  79 bpm. Exam Location:  Inpatient Procedure: 2D Echo, Cardiac Doppler, Color Doppler and Intracardiac            Opacification Agent Indications:    NSTEMI  History:        Patient has no prior history of Echocardiogram examinations.                 Cardiomyopathy and CHF; Risk Factors:Dyslipidemia, Hypertension                 and Diabetes.  Sonographer:    Luisa Hart RDCS Referring Phys: [phone] [facility]ERT[facility]   Sonographer Comments: Technically difficult study due to poor echo windows. IMPRESSIONS  1. Left ventricular ejection fraction, by estimation, is 20 to 25%. The left ventricle has severely decreased function. The left ventricle demonstrates global hypokinesis. Left ventricular diastolic parameters are consistent with Grade I diastolic dysfunction (impaired relaxation).  2. Right ventricular systolic function is normal. The right ventricular size is mildly enlarged.  3. A small pericardial effusion is present. The pericardial effusion is circumferential.  4. The mitral valve is normal in structure. Mild to moderate mitral valve regurgitation. No evidence of mitral stenosis.  5. The aortic valve is normal in structure. Aortic valve regurgitation is not visualized. No aortic stenosis is present.  6. Hypodense mass in the liver suspected to be a hemagioma- may benefit from a dedicated ultrasound of the liver. The inferior vena cava is normal in size with greater than 50% respiratory variability, suggesting right atrial pressure of 3 mmHg. Comparison(s): No prior Echocardiogram. FINDINGS  Left Ventricle: Left ventricular ejection fraction, by estimation, is 20 to 25%. The left ventricle has severely decreased function. The left ventricle demonstrates global hypokinesis. Definity contrast agent was given IV to delineate the left ventricular endocardial borders. The left ventricular internal cavity size was normal in size. There is no left ventricular hypertrophy. Left ventricular diastolic parameters are consistent with Grade I diastolic dysfunction (impaired relaxation). Right Ventricle: The right ventricular size is mildly enlarged. No increase in right ventricular wall thickness. Right ventricular systolic function is normal. Left Atrium: Left atrial size was normal in size. Right Atrium: Right atrial size was normal in size. Pericardium: A small pericardial effusion is present. The pericardial effusion is  circumferential. Presence of epicardial fat layer. Mitral Valve: The mitral valve is normal in structure. Mild to moderate mitral valve regurgitation. No evidence of mitral valve stenosis. Tricuspid Valve: The tricuspid valve is normal in structure. Tricuspid valve regurgitation is trivial. No evidence of tricuspid stenosis. Aortic Valve: The aortic valve is normal in structure. Aortic valve regurgitation is not visualized. No aortic stenosis is present. Aortic valve mean gradient measures 3.0 mmHg. Aortic valve peak gradient measures 4.7 mmHg. Aortic valve area, by VTI measures 3.94 cm. Pulmonic Valve: The pulmonic valve was normal in structure. Pulmonic valve regurgitation is not visualized. No evidence of pulmonic stenosis. Aorta: The aortic root is normal in size and structure. Venous: Hypodense mass in the liver suspected to be a hemagioma- may benefit from a dedicated ultrasound of the liver. The inferior vena cava is normal in size with greater than 50% respiratory variability, suggesting right atrial pressure of 3 mmHg. IAS/Shunts: No atrial level shunt detected by color flow Doppler.  LEFT VENTRICLE PLAX 2D LVIDd:         6.30 cm      Diastology LVIDs:         5.80 cm      LV e' medial:  4.95 cm/s LV PW:         1.20 cm      LV E/e' medial:  12.5 LV IVS:        1.10 cm      LV e' lateral:   5.36 cm/s LVOT diam:     2.50 cm      LV E/e' lateral: 11.5 LV SV:         82 LV SV Index:   42 LVOT Area:     4.91 cm  LV Volumes (MOD) LV vol d, MOD A4C: 141.0 ml LV vol s, MOD A4C: 106.0 ml LV SV MOD A4C:     141.0 ml RIGHT VENTRICLE RV Basal diam:  3.10 cm RV Mid diam:    2.10 cm RV S prime:     15.70 cm/s TAPSE (M-mode): 1.8 cm LEFT ATRIUM             Index        RIGHT ATRIUM           Index LA diam:        4.50 cm 2.28 cm/m   RA Area:     15.40 cm LA Vol (A2C):   31.3 ml 15.89 ml/m  RA Volume:   37.80 ml  19.19 ml/m LA Vol (A4C):   52.3 ml 26.55 ml/m LA Biplane Vol: 40.5 ml 20.56 ml/m  AORTIC VALVE                     PULMONIC VALVE AV Area (Vmax):    3.99 cm     PV Vmax:       1.01 m/s AV Area (Vmean):   3.66 cm     PV Vmean:      68.400 cm/s AV Area (VTI):     3.94 cm     PV VTI:        0.200 m AV Vmax:           108.00 cm/s  PV Peak grad:  4.1 mmHg AV Vmean:          76.000 cm/s  PV Mean grad:  2.0 mmHg AV VTI:            0.208 m AV Peak Grad:      4.7 mmHg AV Mean Grad:      3.0 mmHg LVOT Vmax:         87.80 cm/s LVOT Vmean:        56.600 cm/s LVOT VTI:          0.167 m LVOT/AV VTI ratio: 0.80  AORTA Ao Root diam: 3.80 cm Ao Asc diam:  3.50 cm MITRAL VALVE MV Area (PHT): 5.22 cm    SHUNTS MV Decel Time: 145 msec    Systemic VTI:  0.17 m MR Peak grad: 85.2 mmHg    Systemic Diam: 2.50 cm MR Mean grad: 49.0 mmHg MR Vmax:      461.50 cm/s MR Vmean:     328.0 cm/s MV E velocity: 61.70 cm/s MV A velocity: 61.70 cm/s MV E/A ratio:  1.00 Kardie Tobb DO Electronically signed by Berniece Salines DO Signature Date/Time: 10/14/2021/11:18:29 AM    Final      I have independently reviewed the above radiologic studies and discussed with the patient   Recent Lab Findings: Lab Results  Component Value Date   WBC 6.1 10/14/2021   HGB 13.7 10/14/2021   HCT 38.9 (L) 10/14/2021   PLT 175 10/14/2021  GLUCOSE 82 10/14/2021   CHOL 140 10/14/2021   TRIG 65 10/14/2021   HDL 33 (L) 10/14/2021   LDLCALC 94 10/14/2021   ALT 15 10/13/2021   AST 27 10/13/2021   NA 137 10/14/2021   K 3.8 10/14/2021   CL 107 10/14/2021   CREATININE 1.22 10/14/2021   BUN 14 10/14/2021   CO2 21 (L) 10/14/2021   TSH 0.714 07/05/2018   HGBA1C 5.5 10/13/2021    Assessment / Plan:      NSTEMI, obstructive 3V CAD- currently with chest pain on Heparin drip... the patient stated multiple times he does not wish to have surgery.  He is agreeable to PCI if that is an option.  If not I explained that medicine can be optimized to try and help with his symptoms, however this does fix his underlying disease and may not imrpove his symptoms. DM- well  controlled A1c is 5.5 HLD HTN CKD-Stg II per documentation Dispo- patient currently with chest pain on Heparin,  the patient does not with to have surgery, is agreeable to proceed with PCI if this an option or medical therapy   I  spent 55 minutes counseling the patient face to face.   Ellwood Handler, PA-C 10/14/2021 12:50 PM  Agree with above assessment.  Patient examined, images of coronary angiogram and 2D echocardiogram and chest x-ray personally reviewed and counseled with patient. 76 year old diabetic lives alone after his wife died in September 12, 2017 admitted after having a stress test and developing chest pains and non-STEMI.  He has longstanding low EF and mild heart failure but has been compensated.  Cardiac catheterization today demonstrates high-grade 90 to 95% proximal LAD stenosis with chronic occlusion of the RCA with poor targets for grafting and moderate disease of the circumflex.  Echo shows EF of 20% with mild MR. He would be a very high risk for CABG but would benefit from a PCI to the LAD which is probably the source of his current problems.  I have discussed the situation with cardiology and will place the patient n.p.o. after midnight for possible scheduling of PCI tomorrow.  His creatinine today was 1.2.  Dahlia Byes MD Cardiothoracic surgery

## 2021-10-14 NOTE — Progress Notes (Addendum)
Progress Note  Patient Name: Frank Swanson Date of Encounter: 10/14/2021  [facility] Cardiologist: Shirlee More, MD   Subjective   No chest pain overnight. Planned for cardiac cath today.   Inpatient Medications    Scheduled Meds:  aspirin  324 mg Oral Once   aspirin EC  81 mg Oral Daily   atorvastatin  80 mg Oral Daily   carvedilol  6.25 mg Oral BID   empagliflozin  10 mg Oral Daily   insulin aspart  0-9 Units Subcutaneous TID WC   lisinopril  2.5 mg Oral Daily   sodium chloride flush  3 mL Intravenous Q12H   spironolactone  25 mg Oral Daily   Continuous Infusions:  sodium chloride     sodium chloride 50 mL/hr at 10/14/21 0600   heparin 1,200 Units/hr (10/14/21 0600)   PRN Meds: sodium chloride, acetaminophen, nitroGLYCERIN, ondansetron (ZOFRAN) IV, sodium chloride flush   Vital Signs    Vitals:   10/13/21 1600 10/13/21 2042 10/14/21 0015 10/14/21 0424  BP: (!) 144/81 106/60 98/64 108/64  Pulse: 97 87 74 72  Resp:  16 18 18   Temp:  97.7 F (36.5 C) 97.9 F (36.6 C) 98.1 F (36.7 C)  TempSrc:  Oral Oral Oral  SpO2: 99% 97% 96% 96%  Weight:    81.1 kg  Height:        Intake/Output Summary (Last 24 hours) at 10/14/2021 0650 Last data filed at 10/14/2021 0600 Gross per 24 hour  Intake 444.88 ml  Output 750 ml  Net -305.12 ml      10/14/2021    4:24 AM 10/13/2021    1:06 PM 10/13/2021   11:45 AM  Last 3 Weights  Weight (lbs) 178 lb 12.8 oz 179 lb 179 lb  Weight (kg) 81.103 kg 81.194 kg 81.194 kg      Telemetry    Sinus Rhythm - Personally Reviewed  ECG    SR, 71 bpm TWI in inferolateral leads - Personally Reviewed  Physical Exam   GEN: No acute distress.   Neck: No JVD Cardiac: RRR, no murmurs, rubs, or gallops.  Respiratory: Clear to auscultation bilaterally. GI: Soft, nontender, non-distended  MS: No edema; No deformity. Neuro:  Nonfocal  Psych: Normal affect   Labs    High Sensitivity Troponin:   Recent Labs  Lab 10/13/21 1315  10/13/21 1619  TROPONINIHS 666* 1,442*     Chemistry Recent Labs  Lab 10/13/21 1315  NA 134*  K 3.5  CL 104  CO2 22  GLUCOSE 118*  BUN 12  CREATININE 1.09  CALCIUM 8.6*  PROT 6.3*  ALBUMIN 3.3*  AST 27  ALT 15  ALKPHOS 54  BILITOT 1.1  GFRNONAA >60  ANIONGAP 8    Lipids No results for input(s): CHOL, TRIG, HDL, LABVLDL, LDLCALC, CHOLHDL in the last 168 hours.  Hematology Recent Labs  Lab 10/13/21 1315  WBC 6.4  RBC 4.51  HGB 13.8  HCT 40.1  MCV 88.9  MCH 30.6  MCHC 34.4  RDW 11.9  PLT 189   Thyroid No results for input(s): TSH, FREET4 in the last 168 hours.  BNPNo results for input(s): BNP, PROBNP in the last 168 hours.  DDimer No results for input(s): DDIMER in the last 168 hours.   Radiology    DG Chest Portable 1 View  Result Date: 10/13/2021 CLINICAL DATA:  Chest pain. EXAM: PORTABLE CHEST 1 VIEW COMPARISON:  Prior chest radiographs 02/29/2012 and earlier. FINDINGS: Heart size within normal limits.  Aortic atherosclerosis. No appreciable airspace consolidation or pulmonary edema. No evidence of pleural effusion or pneumothorax. No acute bony abnormality identified. IMPRESSION: No evidence of acute cardiopulmonary abnormality. Aortic Atherosclerosis (ICD10-I70.0). Electronically Signed   By: Kellie Simmering D.O.   On: 10/13/2021 13:27    Cardiac Studies   Echo: pending  Patient Profile     76 y.o. 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  Assessment & Plan    Chest pain/abnormal stress test/NSTEMI: Underwent outpatient Lexiscan Myoview today which was abnormal with concern for ischemia within the LAD territory.  Patient also had significant chest discomfort during and after testing requiring aminophylline and nitroglycerin.  Was pain free on arrival. Initial troponin [phone]. No chest pain overnight.  -- Continue IV heparin, aspirin,  statin, Coreg, and Spiro -- Echo pending -- Planned for cardiac cath today  Shared Decision Making/Informed Consent The risks [stroke (1 in 1000), death (1 in 1000), kidney failure [usually temporary] (1 in 500), bleeding (1 in 200), allergic reaction [possibly serious] (1 in 200)], benefits (diagnostic support and management of coronary artery disease) and alternatives of a cardiac catheterization were discussed in detail with Mr. Slavey and he is willing to proceed.  HFrEF: Known LVEF of 40-45%, lexiscan myoview reported EF of 18%. No volume overload noted on exam  -- Echo with prelim read of EF 25% -- continue coreg, spiro. Hold lisinopril, consider transition to Entresto if BP permits   HTN: stable -- continue coreg, spiro   HLD: has been on pravastatin -- lipid panel pending -- switch to atorvastatin 80mg  daily    DM: hold home glipizide -- SSI while inpatient   For questions or updates, please contact [facility] Please consult [email or web address] for contact info under        Signed, Reino Bellis, NP  10/14/2021, 6:50 AM    ATTENDING ATTESTATION:  After conducting a review of all available clinical information with the care team, interviewing the patient, and performing a physical exam, I agree with the findings and plan described in this note.   GEN: No acute distress.   HEENT:  MMM, no JVD, no scleral icterus Cardiac: RRR, no murmurs, rubs, or gallops.  Respiratory: Clear to auscultation bilaterally. GI: Soft, nontender, non-distended  MS: No edema; No deformity. Neuro:  Nonfocal  Vasc:  +2 radial pulses  Patient remains well and is euvolemic on exam.  I reviewed his echocardiogram which demonstrated severe LV dysfunction and what looks like an anterior wall infarction with multiple wall motion abnormalities in addition.  I will discontinue his lisinopril and start losartan in preparation for transition to [facility] if needed.  I will increase his Coreg to 25 mg twice  daily.  Continue Jardiance and spironolactone.  I suspect he will have severe multivessel disease or an occluded/infarcted LAD.    I am concerned about the viability of the anterior wall given anterior Q waves.  Further recommendations will be informed by the results of his coronary angiography study.  I have reviewed the risks, indications, and alternatives to cardiac catheterization, possible angioplasty, and stenting with the patient. Risks include but are not limited to bleeding, infection, vascular injury, stroke, myocardial infection, arrhythmia, kidney injury, radiation-related injury in the case of prolonged fluoroscopy use, emergency cardiac surgery, and death. The patient understands the risks of serious complication is 1-2 in [id number] with diagnostic cardiac cath and 1-2% or less  with angioplasty/stenting.    Lenna Sciara, MD Pager [phone]

## 2021-10-14 NOTE — Progress Notes (Signed)
ANTICOAGULATION CONSULT NOTE   Pharmacy Consult for Heparin Indication: chest pain/ACS  No Known Allergies  Patient Measurements: Height: 5\' 9"  (175.3 cm) Weight: 81.2 kg (179 lb) IBW/kg (Calculated) : 70.7 Heparin Dosing Weight: 81.2 kg   Vital Signs: Temp: 97.7 F (36.5 C) (05/31 2042) Temp Source: Oral (05/31 2042) BP: 106/60 (05/31 2042) Pulse Rate: 87 (05/31 2042)  Labs: Recent Labs    10/13/21 1315 10/13/21 1619 10/13/21 2313  HGB 13.8  --   --   HCT 40.1  --   --   PLT 189  --   --   HEPARINUNFRC  --   --  0.14*  CREATININE 1.09  --   --   TROPONINIHS 666* 1,442*  --      Estimated Creatinine Clearance: 56.8 mL/min (by C-G formula based on SCr of 1.09 mg/dL).   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   Mild CAD 05/06/2015   Type 2 diabetes mellitus without complication, without long-term current use of insulin (HCC) 03/17/2017    Medications:  Scheduled:   aspirin  324 mg Oral Once   aspirin  81 mg Oral Pre-Cath   aspirin EC  81 mg Oral Daily   atorvastatin  80 mg Oral Daily   carvedilol  6.25 mg Oral BID   empagliflozin  10 mg Oral Daily   insulin aspart  0-9 Units Subcutaneous TID WC   lisinopril  2.5 mg Oral Daily   sodium chloride flush  3 mL Intravenous Q12H   spironolactone  25 mg Oral Daily   Infusions:   sodium chloride 10 mL/hr at 10/13/21 2015   sodium chloride     sodium chloride     Followed by   sodium chloride     heparin 1,000 Units/hr (10/13/21 2200)    Assessment: 76 yo M presenting with ACS, not on  anticoagulation PTA. Baseline Hgb 13.8, plt 189. No signs/symptoms of bleeding noted. Pharmacy consulted for heparin dosing.   6/1 AM update:  Heparin level sub-therapeutic   Goal of Therapy:  Heparin level 0.3-0.7 units/ml Monitor platelets by anticoagulation protocol: Yes   Plan:  Inc heparin to 1200 units/hr Re-check heparin level in 8 hours  Narda Bonds, PharmD, [place] Pharmacist Phone: [phone]

## 2021-10-14 NOTE — H&P (View-Only) (Signed)
Progress Note  Patient Name: Frank Swanson Date of Encounter: 10/14/2021  [place] [facility] Cardiologist: Frank More, MD   Subjective   No chest pain overnight. Planned for cardiac cath today.   Inpatient Medications    Scheduled Meds:  aspirin  324 mg Oral Once   aspirin EC  81 mg Oral Daily   atorvastatin  80 mg Oral Daily   carvedilol  6.25 mg Oral BID   empagliflozin  10 mg Oral Daily   insulin aspart  0-9 Units Subcutaneous TID WC   lisinopril  2.5 mg Oral Daily   sodium chloride flush  3 mL Intravenous Q12H   spironolactone  25 mg Oral Daily   Continuous Infusions:  sodium chloride     sodium chloride 50 mL/hr at 10/14/21 0600   heparin 1,200 Units/hr (10/14/21 0600)   PRN Meds: sodium chloride, acetaminophen, nitroGLYCERIN, ondansetron (ZOFRAN) IV, sodium chloride flush   Vital Signs    Vitals:   10/13/21 1600 10/13/21 2042 10/14/21 0015 10/14/21 0424  BP: (!) 144/81 106/60 98/64 108/64  Pulse: 97 87 74 72  Resp:  16 18 18   Temp:  97.7 F (36.5 C) 97.9 F (36.6 C) 98.1 F (36.7 C)  TempSrc:  Oral Oral Oral  SpO2: 99% 97% 96% 96%  Weight:    81.1 kg  Height:        Intake/Output Summary (Last 24 hours) at 10/14/2021 0650 Last data filed at 10/14/2021 0600 Gross per 24 hour  Intake 444.88 ml  Output 750 ml  Net -305.12 ml      10/14/2021    4:24 AM 10/13/2021    1:06 PM 10/13/2021   11:45 AM  Last 3 Weights  Weight (lbs) 178 lb 12.8 oz 179 lb 179 lb  Weight (kg) 81.103 kg 81.194 kg 81.194 kg      Telemetry    Sinus Rhythm - Personally Reviewed  ECG    SR, 71 bpm TWI in inferolateral leads - Personally Reviewed  Physical Exam   GEN: No acute distress.   Neck: No JVD Cardiac: RRR, no murmurs, rubs, or gallops.  Respiratory: Clear to auscultation bilaterally. GI: Soft, nontender, non-distended  MS: No edema; No deformity. Neuro:  Nonfocal  Psych: Normal affect   Labs    High Sensitivity Troponin:   Recent Labs  Lab 10/13/21 1315  10/13/21 1619  TROPONINIHS 666* 1,442*     Chemistry Recent Labs  Lab 10/13/21 1315  NA 134*  K 3.5  CL 104  CO2 22  GLUCOSE 118*  BUN 12  CREATININE 1.09  CALCIUM 8.6*  PROT 6.3*  ALBUMIN 3.3*  AST 27  ALT 15  ALKPHOS 54  BILITOT 1.1  GFRNONAA >60  ANIONGAP 8    Lipids No results for input(s): CHOL, TRIG, HDL, LABVLDL, LDLCALC, CHOLHDL in the last 168 hours.  Hematology Recent Labs  Lab 10/13/21 1315  WBC 6.4  RBC 4.51  HGB 13.8  HCT 40.1  MCV 88.9  MCH 30.6  MCHC 34.4  RDW 11.9  PLT 189   Thyroid No results for input(s): TSH, FREET4 in the last 168 hours.  BNPNo results for input(s): BNP, PROBNP in the last 168 hours.  DDimer No results for input(s): DDIMER in the last 168 hours.   Radiology    DG Chest Portable 1 View  Result Date: 10/13/2021 CLINICAL DATA:  Chest pain. EXAM: PORTABLE CHEST 1 VIEW COMPARISON:  Prior chest radiographs 02/29/2012 and earlier. FINDINGS: Heart size within normal limits.  Aortic atherosclerosis. No appreciable airspace consolidation or pulmonary edema. No evidence of pleural effusion or pneumothorax. No acute bony abnormality identified. IMPRESSION: No evidence of acute cardiopulmonary abnormality. Aortic Atherosclerosis (ICD10-I70.0). Electronically Signed   By: Frank Swanson D.O.   On: 10/13/2021 13:27    Cardiac Studies   Echo: pending  Patient Profile     76 y.o. 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  Assessment & Plan    Chest pain/abnormal stress test/NSTEMI: Underwent outpatient Lexiscan Myoview today which was abnormal with concern for ischemia within the LAD territory.  Patient also had significant chest discomfort during and after testing requiring aminophylline and nitroglycerin.  Was pain free on arrival. Initial troponin [phone]. No chest pain overnight.  -- Continue IV heparin, aspirin,  statin, Coreg, and Spiro -- Echo pending -- Planned for cardiac cath today  Shared Decision Making/Informed Consent The risks [stroke (1 in 1000), death (1 in 1000), kidney failure [usually temporary] (1 in 500), bleeding (1 in 200), allergic reaction [possibly serious] (1 in 200)], benefits (diagnostic support and management of coronary artery disease) and alternatives of a cardiac catheterization were discussed in detail with Frank Swanson and he is willing to proceed.  HFrEF: Known LVEF of 40-45%, lexiscan myoview reported EF of 18%. No volume overload noted on exam  -- Echo with prelim read of EF 25% -- continue coreg, spiro. Hold lisinopril, consider transition to Entresto if BP permits   HTN: stable -- continue coreg, spiro   HLD: has been on pravastatin -- lipid panel pending -- switch to atorvastatin 80mg  daily    DM: hold home glipizide -- SSI while inpatient   For questions or updates, please contact Frank Swanson Please consult [email or web address] for contact info under        Signed, Frank Bellis, NP  10/14/2021, 6:50 AM    ATTENDING ATTESTATION:  After conducting a review of all available clinical information with the care team, interviewing the patient, and performing a physical exam, I agree with the findings and plan described in this note.   GEN: No acute distress.   HEENT:  MMM, no JVD, no scleral icterus Cardiac: RRR, no murmurs, rubs, or gallops.  Respiratory: Clear to auscultation bilaterally. GI: Soft, nontender, non-distended  MS: No edema; No deformity. Neuro:  Nonfocal  Vasc:  +2 radial pulses  Patient remains well and is euvolemic on exam.  I reviewed his echocardiogram which demonstrated severe LV dysfunction and what looks like an anterior wall infarction with multiple wall motion abnormalities in addition.  I will discontinue his lisinopril and start losartan in preparation for transition to [facility] if needed.  I will increase his Coreg to 25 mg twice  daily.  Continue Jardiance and spironolactone.  I suspect he will have severe multivessel disease or an occluded/infarcted LAD.    I am concerned about the viability of the anterior wall given anterior Q waves.  Further recommendations will be informed by the results of his coronary angiography study.  I have reviewed the risks, indications, and alternatives to cardiac catheterization, possible angioplasty, and stenting with the patient. Risks include but are not limited to bleeding, infection, vascular injury, stroke, myocardial infection, arrhythmia, kidney injury, radiation-related injury in the case of prolonged fluoroscopy use, emergency cardiac surgery, and death. The patient understands the risks of serious complication is 1-2 in 123XX123 with diagnostic cardiac cath and 1-2% or less  with angioplasty/stenting.    Lenna Sciara, MD Pager [phone]

## 2021-10-14 NOTE — TOC Benefit Eligibility Note (Signed)
Patient Advocate Encounter  Insurance verification completed.    The patient is currently admitted and upon discharge could be taking Entresto 24-26 mg.  The current 30 day co-pay is, $47.00.   The patient is currently admitted and upon discharge could be taking Farxiga 10 mg.  The current 30 day co-pay is, $47.00.   The patient is currently admitted and upon discharge could be taking Jardiance 10 mg.  The current 30 day co-pay is, $47.00.   The patient is insured through [employer] Medicare Part D     Frank Swanson, CPhT Pharmacy Patient Advocate Specialist [facility] Patient Advocate Team Direct Number: [phone]  Fax: [phone]        

## 2021-10-14 NOTE — Interval H&P Note (Signed)
History and Physical Interval Note:  10/14/2021 11:37 AM  Marisa Sprinkles  has presented today for surgery, with the diagnosis of abnormal stress.  The various methods of treatment have been discussed with the patient and family. After consideration of risks, benefits and other options for treatment, the patient has consented to  Procedure(s): LEFT HEART CATH AND CORONARY ANGIOGRAPHY (N/A) as a surgical intervention.  The patient's history has been reviewed, patient examined, no change in status, stable for surgery.  I have reviewed the patient's chart and labs.  Questions were answered to the patient's satisfaction.   Cath Lab Visit (complete for each Cath Lab visit)  Clinical Evaluation Leading to the Procedure:   ACS: Yes.    Non-ACS:    Anginal Classification: CCS IV  Anti-ischemic medical therapy: No Therapy  Non-Invasive Test Results: High-risk stress test findings: cardiac mortality >3%/year  Prior CABG: No previous CABG        [facility] 10/14/2021 11:38 AM

## 2021-10-14 NOTE — Progress Notes (Signed)
[place] for Heparin Indication: chest pain/ACS  No Known Allergies  Patient Measurements: Height: 5\' 9"  (175.3 cm) Weight: 81.1 kg (178 lb 12.8 oz) IBW/kg (Calculated) : 70.7 Heparin Dosing Weight: 81.2 kg   Vital Signs: Temp: 98.1 F (36.7 C) (06/01 0424) Temp Source: Oral (06/01 0424) BP: 108/64 (06/01 0424) Pulse Rate: 72 (06/01 0424)  Labs: Recent Labs    10/13/21 1315 10/13/21 1619 10/13/21 2313  HGB 13.8  --   --   HCT 40.1  --   --   PLT 189  --   --   HEPARINUNFRC  --   --  0.14*  CREATININE 1.09  --   --   TROPONINIHS 666* 1,442*  --      Estimated Creatinine Clearance: 56.8 mL/min (by C-G formula based on SCr of 1.09 mg/dL).   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   Mild CAD 05/06/2015   Type 2 diabetes mellitus without complication, without long-term current use of insulin (HCC) 03/17/2017    Medications:  Scheduled:   aspirin  324 mg Oral Once   aspirin EC  81 mg Oral Daily   atorvastatin  80 mg Oral Daily   carvedilol  6.25 mg Oral BID   empagliflozin  10 mg Oral Daily   insulin aspart  0-9 Units Subcutaneous TID WC   lisinopril  2.5 mg Oral Daily   sodium chloride flush  3 mL Intravenous Q12H   spironolactone  25 mg Oral Daily   Infusions:   sodium chloride     sodium chloride 50 mL/hr at 10/14/21 0600   heparin 1,200 Units/hr (10/14/21 0600)    Assessment: 76 yo M presenting with ACS, not on anticoagulation PTA. Baseline Hgb 13.8, plt 189. No signs/symptoms of bleeding noted.  Pharmacy consulted for heparin dosing.   Heparin level just below goal on recheck this morning. CBC stable overnight. No bleeding noted on rounds this morning.   Goal of Therapy:  Heparin level 0.3-0.7 units/ml Monitor platelets by anticoagulation protocol: Yes   Plan:  Increase heparin to 1350 units/hr Follow up after cath  Erin Hearing PharmD., BCPS Clinical Pharmacist 10/14/2021 7:36 AM

## 2021-10-15 ENCOUNTER — Other Ambulatory Visit (HOSPITAL_COMMUNITY): Payer: Self-pay

## 2021-10-15 ENCOUNTER — Encounter (HOSPITAL_COMMUNITY): Payer: Self-pay | Admitting: Internal Medicine

## 2021-10-15 DIAGNOSIS — I255 Ischemic cardiomyopathy: Secondary | ICD-10-CM

## 2021-10-15 LAB — CBC
HCT: 36.9 % — ABNORMAL LOW (ref 39.0–52.0)
Hemoglobin: 12.4 g/dL — ABNORMAL LOW (ref 13.0–17.0)
MCH: 30 pg (ref 26.0–34.0)
MCHC: 33.6 g/dL (ref 30.0–36.0)
MCV: 89.3 fL (ref 80.0–100.0)
Platelets: 183 10*3/uL (ref 150–400)
RBC: 4.13 MIL/uL — ABNORMAL LOW (ref 4.22–5.81)
RDW: 12 % (ref 11.5–15.5)
WBC: 6.4 10*3/uL (ref 4.0–10.5)
nRBC: 0 % (ref 0.0–0.2)

## 2021-10-15 LAB — BASIC METABOLIC PANEL
Anion gap: 8 (ref 5–15)
BUN: 20 mg/dL (ref 8–23)
CO2: 20 mmol/L — ABNORMAL LOW (ref 22–32)
Calcium: 8.4 mg/dL — ABNORMAL LOW (ref 8.9–10.3)
Chloride: 105 mmol/L (ref 98–111)
Creatinine, Ser: 1.23 mg/dL (ref 0.61–1.24)
GFR, Estimated: >60 mL/min (ref >60)
Glucose, Bld: 74 mg/dL (ref 70–99)
Potassium: 3.8 mmol/L (ref 3.5–5.1)
Sodium: 133 mmol/L — ABNORMAL LOW (ref 135–145)

## 2021-10-15 LAB — HEPARIN LEVEL (UNFRACTIONATED)
Heparin Unfractionated: <0.1 IU/mL — ABNORMAL LOW (ref 0.30–0.70)
Heparin Unfractionated: 0.15 IU/mL — ABNORMAL LOW (ref 0.30–0.70)

## 2021-10-15 LAB — GLUCOSE, CAPILLARY
Glucose-Capillary: 87 mg/dL (ref 70–99)
Glucose-Capillary: 98 mg/dL (ref 70–99)

## 2021-10-15 MED ORDER — ISOSORBIDE MONONITRATE ER 30 MG PO TB24
30.0000 mg | ORAL_TABLET | Freq: Every day | ORAL | 0 refills | Status: AC
  Filled 2021-10-15: qty 90, 90d supply, fill #0

## 2021-10-15 MED ORDER — ATORVASTATIN CALCIUM 80 MG PO TABS
80.0000 mg | ORAL_TABLET | Freq: Every day | ORAL | 0 refills | Status: AC
  Filled 2021-10-15: qty 90, 90d supply, fill #0

## 2021-10-15 MED ORDER — LOSARTAN POTASSIUM 25 MG PO TABS
12.5000 mg | ORAL_TABLET | Freq: Every day | ORAL | 2 refills | Status: AC
  Filled 2021-10-15: qty 45, 90d supply, fill #0

## 2021-10-15 MED ORDER — ISOSORBIDE MONONITRATE ER 30 MG PO TB24: 30.0000 mg | ORAL_TABLET | Freq: Every day | ORAL | Status: DC

## 2021-10-15 MED ORDER — EMPAGLIFLOZIN 10 MG PO TABS
10.0000 mg | ORAL_TABLET | Freq: Every day | ORAL | 0 refills | Status: AC
  Filled 2021-10-15: qty 30, 30d supply, fill #0

## 2021-10-15 NOTE — Progress Notes (Signed)
Heart Failure Nurse Navigator Progress Note  PCP: Patient, No Pcp Per (Inactive) PCP-Cardiologist: [facility] Admission Diagnosis: NSTEMI Admitted from: Office via EMS  Presentation:   Frank Swanson presented with abnormal stress test, chest pain, has been using nitro at home repeatedly, IV Heparin started, BP 135/91, HR 97, troponins 666, patient went to cath lab on 10/14/21, found to have severe 3 vessel CAD. Patient declined heart surgery at this time and wishes to med manage.   Education was done with patient and step son Louie Casa ) at bedside. Heart failure sign and symptoms , daily weights, when to call his doctor or go to the ER, Diet/ fluid restrictions, taking all medications as prescribed and attending all medical appointments. Patient voiced his understanding and is scheduled for a hospital follow up with Hf TOC on 10/27/21 @ 9 am.   ECHO/ LVEF: EF 20-25 %  Clinical Course:  Past Medical History:  Diagnosis Date   Cardiomyopathy (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   Mild CAD 05/06/2015   Type 2 diabetes mellitus without complication, without long-term current use of insulin ([place]) 03/17/2017     Social History   Socioeconomic History   Marital status: Widowed    Spouse name: Not on file   Number of children: 0   Years of education: Not on file   Highest education level: 9th grade  Occupational History    Comment: Retired  Tobacco Use   Smoking status: Every Day    Packs/day: 1.00    Years: 65.00    Pack years: 65.00    Types: Cigarettes   Smokeless tobacco: Never  Vaping Use   Vaping Use: Never used  Substance and Sexual  Activity   Alcohol use: No   Drug use: No   Sexual activity: Not on file  Other Topics Concern   Not on file  Social History Narrative   Not on file   Social Determinants of Health   Financial Resource Strain: Low Risk    Difficulty of Paying Living Expenses: Not very hard  Food Insecurity: No Food Insecurity   Worried About Charity fundraiser in the Last Year: Never true   [place] in the Last Year: Never true  Transportation Needs: No Transportation Needs   Lack of Transportation (Medical): No   Lack of Transportation (Non-Medical): No  Physical Activity: Not on file  Stress: Not on file  Social Connections: Not on file   Education Assessment and Provision:  Detailed education and instructions provided on heart failure disease management including the following:  Signs and symptoms of Heart Failure When to call the physician Importance of daily weights Low sodium diet Fluid restriction Medication management Anticipated future follow-up appointments  Patient education given on each of the above topics.  Patient acknowledges understanding via teach back method and acceptance of all instructions.  Education Materials:  "Living Better With Heart Failure" Booklet, HF zone tool, & Daily Weight Tracker Tool.  Patient has scale at home: yes Patient has pill box at home: NA    High Risk Criteria for Readmission and/or Poor Patient Outcomes: Heart failure hospital admissions (last 6 months): 0  No Show rate: 0 Difficult social situation: No Demonstrates medication adherence: Yes Primary Language: english Literacy level: reading, writing, and comprehension.   Barriers of Care:   Low EF  Diet/ fluid restrictions Smoking ( 63 year hx)   Considerations/Referrals:   Referral made to Heart Failure Pharmacist Stewardship: yes Referral made to Heart Failure CSW/NCM TOC: No Referral made to Heart & Vascular TOC clinic: Yes, 10/27/21 @ 9 am  Items for Follow-up on  DC/TOC: Smoking cessation - 65 yr history Diet/ fluid restrictions Declined CABG, med manage.    Earnestine Leys, BSN, Clinical cytogeneticist Only

## 2021-10-15 NOTE — Progress Notes (Signed)
  Transition of Care [facility]) Screening Note   Patient Details  Name: Frank Swanson Date of Birth: 1946/05/06   Transition of Care [facility]) CM/SW Contact:    Milas Gain, [place] Phone Number: 10/15/2021, 2:56 PM    Transition of Care Department [facility]) has reviewed patient and no TOC needs have been identified at this time. We will continue to monitor patient advancement through interdisciplinary progression rounds. If new patient transition needs arise, please place a TOC consult.

## 2021-10-15 NOTE — Progress Notes (Signed)
Heart Failure Stewardship Pharmacist Progress Note   PCP: Patient, No Pcp Per (Inactive) PCP-Cardiologist: Norman Herrlich, MD    HPI:  76 yo M with PMH of CAD, HTN, CKD, T2DM, and HLD. Sent to the ED on 5/31 following an abnormal stress test that showed ischemia in LAD and LVEF 18%. CXR in ED with no edema. ECHO on 6/1 with LVEF 20-25%, G1DD, mild-mod MR. Also found to have a hypodense mass in the liver - recommend further workup. He underwent LHC on 6/1 and was found to have severe 3 vessel disease warranting a CABG evaluation. He ultimately declined surgery and wishes to be medically managed at this time with plans to follow up on interventions as outpatient.  Current HF Medications: Beta Blocker: carvedilol 25 mg BID ACE/ARB/ARNI: losartan 25 mg daily Aldosterone Antagonist: spironolactone 25 mg daily SGLT2i: Jardiance 10 mg daily Other: Imdur 30 mg daily  Prior to admission HF Medications: Diuretic: furosemide 20 mg daily Beta blocker: carvedilol 6.25 mg BID ACE/ARB/ARNI: lisinopril 2.5 mg daily Aldosterone Antagonist: spironolactone 25 mg daily  Pertinent Lab Values: Serum creatinine 1.23, BUN 20, Potassium 3.8, Sodium 133, A1c 5.5  Vital Signs: Weight: 178 lbs (admission weight: 179 lbs) Blood pressure: 90-110/60s  Heart rate: 70s  I/O: -1.1L yesterday; net -1L  Medication Assistance / Insurance Benefits Check: Does the patient have prescription insurance?  Yes Type of insurance plan: UHC Medicare  Does the patient qualify for medication assistance through manufacturers or grants?   Pending Eligible grants and/or patient assistance programs: pending Medication assistance applications in progress: none  Medication assistance applications approved: none Approved medication assistance renewals will be completed by: pending  Outpatient Pharmacy:  Prior to admission outpatient pharmacy: [facility] Is the patient willing to use [facility] TOC pharmacy at discharge? Yes Is the patient  willing to transition their outpatient pharmacy to utilize a [facility] outpatient pharmacy?   Pending    Assessment/Plan: 1) Acute systolic CHF (LVEF 20-25%), due to ICM refusing CABG at this time. NYHA class II symptoms. - Continue carvedilol 25 mg BID - Continue losartan 25 mg daily - BP too soft for Entresto at this time - Continue Jardiance 10 mg daily - Continue Imdur 30 mg daily  2) Patient assistance: Sherryll Burger copay $47 - Jardiance/Farxiga copay $47  3)  Education  - Patient has been educated on current HF medications and potential additions to HF medication regimen - Patient verbalizes understanding that over the next few months, these medication doses may change and more medications may be added to optimize HF regimen - Patient has been educated on basic disease state pathophysiology and goals of therapy   Sharen Hones, PharmD, BCPS Heart Failure Stewardship Pharmacist Phone [phone]

## 2021-10-15 NOTE — Progress Notes (Signed)
CARDIAC REHAB PHASE I   PRE:  Rate/Rhythm: 80 NSR  BP:  Sitting: 96/74      SaO2: 95 RA  MODE:  Ambulation: 220 ft   POST:  Rate/Rhythm: 86 NSR  BP:  Sitting: 98/60      SaO2:   Seen pt from 0935-1000, pt needed moderate assistance getting out of bed, gait belt was applied after pt stood up, no equipment used. Pt was ambulated through hallways with minimal assistance and light assistance with gait belt. Pt did not complain of dizziness or chest pain while ambulating but pt was a little unsteady on his feet, grabbing the wall 3-4 times. Pt was returned to chair and Dr. Lynnette Caffey entered to talk to pt. Will f/u with pt later.  Frank Swanson  9:50 AM 10/15/2021

## 2021-10-15 NOTE — Progress Notes (Addendum)
Seen pt from 1100-1132, Pt received CHF and MI book. Educated pt on diet, daily weighs, low sodium diet, closely monitoring symptoms, NTG use, smoking cessation, and CRPII. Pt was referred to [facility] in [facility].   Frank Swanson 10/15/2021 11:36 AM

## 2021-10-15 NOTE — Progress Notes (Incomplete)
[place] for Heparin Indication: chest pain/ACS  No Known Allergies  Patient Measurements: Height: 5\' 9"  (175.3 cm) Weight: 81.1 kg (178 lb 12.8 oz) IBW/kg (Calculated) : 70.7 Heparin Dosing Weight: 81.2 kg   Vital Signs: Temp: 98.6 F (37 C) (06/02 0440) Temp Source: Oral (06/02 0440) BP: 111/64 (06/02 0440) Pulse Rate: 64 (06/02 0440)  Labs: Recent Labs    10/13/21 1315 10/13/21 1619 10/13/21 2313 10/14/21 0803 10/15/21 0104  HGB 13.8  --   --  13.7 12.4*  HCT 40.1  --   --  38.9* 36.9*  PLT 189  --   --  175 183  HEPARINUNFRC  --   --  0.14* 0.27* <0.10*  CREATININE 1.09  --   --  1.22 1.23  TROPONINIHS 666* 1,442*  --   --   --      Estimated Creatinine Clearance: 50.3 mL/min (by C-G formula based on SCr of 1.23 mg/dL).   Medical History: Past Medical History:  Diagnosis Date   Cardiomyopathy (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   Mild CAD 05/06/2015   Type 2 diabetes mellitus without complication, without long-term current use of insulin ([facility]) 03/17/2017    Medications:  Scheduled:   aspirin  324 mg Oral Once   aspirin EC  81 mg Oral Daily   atorvastatin  80 mg Oral Daily   carvedilol  25 mg Oral BID   empagliflozin  10 mg Oral Daily   insulin aspart  0-9 Units Subcutaneous TID WC   losartan  25 mg Oral QHS   sodium chloride flush  3 mL Intravenous Q12H   sodium chloride flush  3 mL Intravenous Q12H   spironolactone  25 mg Oral q1800   Infusions:   sodium chloride 50 mL/hr at 10/14/21 0600   sodium chloride     heparin 1,200 Units/hr (10/14/21 2308)     Assessment: 76 yo M presenting with ACS, not on anticoagulation PTA. Baseline Hgb 13.8, plt 189. No signs/symptoms of bleeding noted. Pharmacy consulted for heparin dosing.   Heparin level just below goal on recheck this morning. CBC stable overnight. No bleeding noted on rounds this morning.   Goal of Therapy:  Heparin level 0.3-0.7 units/ml Monitor platelets by anticoagulation protocol: Yes   Plan:  Increase heparin to 1350 units/hr Follow up after cath  Erin Hearing PharmD., BCPS Clinical Pharmacist 10/15/2021 7:35 AM

## 2021-10-15 NOTE — Discharge Summary (Addendum)
Discharge Summary    Patient ID: Frank Swanson MRN: [id number]; DOB: 12-30-1945  Admit date: 10/13/2021 Discharge date: 10/15/2021  PCP:  Patient, No Pcp Per (Inactive)   [facility] Providers Cardiologist:  Shirlee More, MD     Discharge Diagnoses    Principal Problem:   NSTEMI (non-ST elevated myocardial infarction) [facility]) Active Problems:   Chronic combined systolic and diastolic heart failure ([place])   Hyperlipidemia   Type 2 diabetes mellitus without complication, without long-term current use of insulin ([place])   Hypertensive heart disease with heart failure (HCC)   Chest pain   Ischemic cardiomyopathy  Diagnostic Studies/Procedures    Cath: 10/14/21       Ost LM to Mid LM lesion is 40% stenosed.   Prox LAD lesion is 90% stenosed.   Ost Cx to Prox Cx lesion is 80% stenosed.   1st Mrg lesion is 60% stenosed.   Mid Cx to Dist Cx lesion is 75% stenosed.   Prox RCA to Mid RCA lesion is 100% stenosed.   LV end diastolic pressure is normal.   Severe 3 vessel obstructive CAD Severely calcified coronary arteries. Low LV EDP 5 mm Hg   Plan: need CT surgery consultation for CABG.   Diagnostic Dominance: Right Echo: 10/14/21   IMPRESSIONS     1. Left ventricular ejection fraction, by estimation, is 20 to 25%. The  left ventricle has severely decreased function. The left ventricle  demonstrates global hypokinesis. Left ventricular diastolic parameters are  consistent with Grade I diastolic  dysfunction (impaired relaxation).   2. Right ventricular systolic function is normal. The right ventricular  size is mildly enlarged.   3. A small pericardial effusion is present. The pericardial effusion is  circumferential.   4. The mitral valve is normal in structure. Mild to moderate mitral valve  regurgitation. No evidence of mitral stenosis.   5. The aortic valve is normal in structure. Aortic valve regurgitation is  not visualized. No aortic stenosis is present.   6.  Hypodense mass in the liver suspected to be a hemagioma- may benefit  from a dedicated ultrasound of the liver. The inferior vena cava is normal  in size with greater than 50% respiratory variability, suggesting right  atrial pressure of 3 mmHg.   Comparison(s): No prior Echocardiogram.   FINDINGS   Left Ventricle: Left ventricular ejection fraction, by estimation, is 20  to 25%. The left ventricle has severely decreased function. The left  ventricle demonstrates global hypokinesis. Definity contrast agent was  given IV to delineate the left  ventricular endocardial borders. The left ventricular internal cavity size  was normal in size. There is no left ventricular hypertrophy. Left  ventricular diastolic parameters are consistent with Grade I diastolic  dysfunction (impaired relaxation).   Right Ventricle: The right ventricular size is mildly enlarged. No  increase in right ventricular wall thickness. Right ventricular systolic  function is normal.   Left Atrium: Left atrial size was normal in size.   Right Atrium: Right atrial size was normal in size.   Pericardium: A small pericardial effusion is present. The pericardial  effusion is circumferential. Presence of epicardial fat layer.   Mitral Valve: The mitral valve is normal in structure. Mild to moderate  mitral valve regurgitation. No evidence of mitral valve stenosis.   Tricuspid Valve: The tricuspid valve is normal in structure. Tricuspid  valve regurgitation is trivial. No evidence of tricuspid stenosis.   Aortic Valve: The aortic valve is normal in structure. Aortic valve  regurgitation is not visualized. No aortic stenosis is present. Aortic  valve mean gradient measures 3.0 mmHg. Aortic valve peak gradient measures  4.7 mmHg. Aortic valve area, by VTI  measures 3.94 cm.   Pulmonic Valve: The pulmonic valve was normal in structure. Pulmonic valve  regurgitation is not visualized. No evidence of pulmonic stenosis.    Aorta: The aortic root is normal in size and structure.   Venous: Hypodense mass in the liver suspected to be a hemagioma- may  benefit from a dedicated ultrasound of the liver. The inferior vena cava  is normal in size with greater than 50% respiratory variability,  suggesting right atrial pressure of 3 mmHg.   IAS/Shunts: No atrial level shunt detected by color flow Doppler.  _____________   History of Present Illness     Frank Swanson is a 76 y.o. 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  Frank Swanson is a 76 year old male with past medical history of nonobstructive CAD on cath >20 years ago.  He has been followed by Dr. Bettina Gavia as an outpatient.  He was seen recently by Dr. Bettina Gavia several months back and noted some stable angina but refused further ischemic evaluation at that time.  There were multiple phone calls from his pharmacist who was alarmed about the frequent refills on his sublingual nitroglycerin.  As a result he was scheduled to have an outpatient stress test. He presented to [facility] 5/[facility] and underwent stress testing which was reported to be grossly abnormal with concern for ischemia in the LAD territory and distal portion of the anterior wall with a fixed defect though notable reversibility.  Also had a prolonged period of chest pain after test was completed and given aminophylline as well as nitroglycerin with relief of symptoms.  His EF was reported at 18% on Lexiscan.  He was evaluated by Dr. Agustin Cree with recommendations to be transferred to [facility] for further evaluation and likely cardiac catheterization.  Initially was reluctant but agreeable and EMS was called.  Code STEMI was initially called in the field but canceled on arrival.   In the ED labs showed sodium 134, potassium 3.5, creatinine 1.09, troponin 666, WBC 6.4, hemoglobin 13.8.  EKG  showed sinus tachycardia, 99 bpm, frequent PVCs, nonspecific T wave changes.  Chest x-ray negative.   Evaluated in the ED and pain-free.  He was started on IV heparin.  Cardiology asked to evaluate further for admission.  Hospital Course     Consultants: TCTS  Chest pain/abnormal stress test/NSTEMI: Underwent outpatient Lexiscan Myoview today which was abnormal with concern for ischemia within the LAD territory.  Patient also had significant chest discomfort during and after testing requiring aminophylline and nitroglycerin.  Was pain free on arrival. Initial troponin [phone]. Underwent cardiac cath noted above with severe multivessel disease, including 100% pRCA, 80% ostial Lcx, and 90% pLAD, 40% LM. He was seen by TCTS, Dr. Darcey Nora and refused CABG,also deemed not a candidate for CABG. There was also concern regarding viability of the anterior wall. Long discussion with Dr. Ali Lowe and patient, decision was made to DC home with outpatient follow up in the office as well as TCTS with Dr. Darcey Nora.  -- Continue aspirin, statin, Coreg, losartan and Spiro. Holding on adding plavix at discharge given potential for CABG in the future to prevent need for washout period.    HFrEF: Known LVEF of 40-45%, lexiscan myoview  reported EF of 18%. No volume overload noted on exam. LVEDP on cath 5. LVEF on echo 20-25% with g1DD, mildly enlarged RV, small pericardial effusion, mild to moderate MR -- continue coreg, spiro, losartan and jardiance. Blood pressures remain borderline soft, do not think he would tolerate Entresto at this time   HTN: stable, but soft -- continue coreg, spiro, losartan -- coreg was initally increased to 25mg  BID from home dose of 6.25mg  BID but blood pressures in the 0000000 systolic. Reduced back to 6.25mg  BID and cut losartan 12.5mg  daily   HLD: has been on pravastatin -- LDL 94 -- switched to atorvastatin 80mg  daily this admission -- FLP/LFTs in 8 weeks    DM: resumed on home  glipizide, Hgb A1c 5.5 -- treated with SSI while inpatient  -- started on jardiance   Patient was seen by Dr. Ali Lowe and deemed stable for discharge home. Follow up arranged in the office. Follow up to be arranged with TCTS per Thurmond Butts. Medications sent to [facility]. Educated by pharmD prior to discharge.   Did the patient have an acute coronary syndrome (MI, NSTEMI, STEMI, etc) this admission?:  Yes                               AHA/ACC Clinical Performance & Quality Measures: Aspirin prescribed? - Yes ADP Receptor Inhibitor (Plavix/Clopidogrel, Brilinta/Ticagrelor or Effient/Prasugrel) prescribed (includes medically managed patients)? - Yes Beta Blocker prescribed? - Yes High Intensity Statin (Lipitor 40-80mg  or Crestor 20-40mg ) prescribed? - Yes EF assessed during THIS hospitalization? - Yes For EF <40%, was ACEI/ARB prescribed? - Yes For EF <40%, Aldosterone Antagonist (Spironolactone or Eplerenone) prescribed? - Yes Cardiac Rehab Phase II ordered (including medically managed patients)? - Yes       The patient will be scheduled for a TOC follow up appointment in 10-14 days.  A message has been sent to the [facility] and Scheduling Pool at the office where the patient should be seen for follow up.  _____________  Discharge Vitals Blood pressure 99/61, pulse 67, temperature 97.9 F (36.6 C), temperature source Oral, resp. rate 15, height 5\' 9"  (1.753 m), weight 81.1 kg, SpO2 98 %.  Filed Weights   10/13/21 1306 10/14/21 0424  Weight: 81.2 kg 81.1 kg    Labs & Radiologic Studies    CBC Recent Labs    10/13/21 1315 10/14/21 0803 10/15/21 0104  WBC 6.4 6.1 6.4  NEUTROABS 4.4  --   --   HGB 13.8 13.7 12.4*  HCT 40.1 38.9* 36.9*  MCV 88.9 87.6 89.3  PLT 189 175 [id number]   Basic Metabolic Panel Recent Labs    10/14/21 0803 10/15/21 0104  NA 137 133*  K 3.8 3.8  CL 107 105  CO2 21* 20*  GLUCOSE 82 74  BUN 14 20  CREATININE 1.22 1.23  CALCIUM 8.6* 8.4*   Liver  Function Tests Recent Labs    10/13/21 1315  AST 27  ALT 15  ALKPHOS 54  BILITOT 1.1  PROT 6.3*  ALBUMIN 3.3*   No results for input(s): LIPASE, AMYLASE in the last 72 hours. High Sensitivity Troponin:   Recent Labs  Lab 10/13/21 1315 10/13/21 1619  TROPONINIHS 666* 1,442*    BNP Invalid input(s): POCBNP D-Dimer No results for input(s): DDIMER in the last 72 hours. Hemoglobin A1C Recent Labs    10/13/21 1619  HGBA1C 5.5   Fasting Lipid Panel Recent Labs  10/14/21 0803  CHOL 140  HDL 33*  LDLCALC 94  TRIG 65  CHOLHDL 4.2   Thyroid Function Tests No results for input(s): TSH, T4TOTAL, T3FREE, THYROIDAB in the last 72 hours.  Invalid input(s): FREET3 _____________  CARDIAC CATHETERIZATION  Result Date: 10/14/2021   Ost LM to Mid LM lesion is 40% stenosed.   Prox LAD lesion is 90% stenosed.   Ost Cx to Prox Cx lesion is 80% stenosed.   1st Mrg lesion is 60% stenosed.   Mid Cx to Dist Cx lesion is 75% stenosed.   Prox RCA to Mid RCA lesion is 100% stenosed.   LV end diastolic pressure is normal. Severe 3 vessel obstructive CAD Severely calcified coronary arteries. Low LV EDP 5 mm Hg Plan: need CT surgery consultation for CABG.  DG Chest Portable 1 View  Result Date: 10/13/2021 CLINICAL DATA:  Chest pain. EXAM: PORTABLE CHEST 1 VIEW COMPARISON:  Prior chest radiographs 02/29/2012 and earlier. FINDINGS: Heart size within normal limits. Aortic atherosclerosis. No appreciable airspace consolidation or pulmonary edema. No evidence of pleural effusion or pneumothorax. No acute bony abnormality identified. IMPRESSION: No evidence of acute cardiopulmonary abnormality. Aortic Atherosclerosis (ICD10-I70.0). Electronically Signed   By: Kellie Simmering D.O.   On: 10/13/2021 13:27   MYOCARDIAL PERFUSION IMAGING  Result Date: 10/14/2021   Findings are consistent with ischemia in LAD territory. The study is high risk.   No ST deviation was noted.   Left ventricular function is  abnormal. Global function is severely reduced. Nuclear stress EF: 18 %. The left ventricular ejection fraction is severely decreased (<30%). End diastolic cavity size is moderately enlarged.   Prior study not available for comparison.   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 Ischemia involving apical, mid and basal portion of atnerior and antero-septal wall. Pt developed prolonged episode of CP after stress test and was transported to the ER.   ECHOCARDIOGRAM COMPLETE  Result Date: 10/14/2021    ECHOCARDIOGRAM REPORT   Patient Name:   Frank Swanson Date of Exam: 10/14/2021 Medical Rec #:  [id number]    Height:       69.0 in Accession #:    [id number]   Weight:       178.8 lb Date of Birth:  Oct 10, 1945    BSA:          1.970 m Patient Age:    76 years     BP:           108/64 mmHg Patient Gender: M            HR:           79 bpm. Exam Location:  Inpatient Procedure: 2D Echo, Cardiac Doppler, Color Doppler and Intracardiac            Opacification Agent Indications:    NSTEMI  History:        Patient has no prior history of Echocardiogram examinations.                 Cardiomyopathy and CHF; Risk Factors:Dyslipidemia, Hypertension                 and Diabetes.  Sonographer:    Luisa Hart RDCS Referring Phys: [phone] [facility]ERTS  Sonographer Comments: Technically difficult study due to poor echo windows. IMPRESSIONS  1. Left ventricular ejection fraction, by estimation, is 20 to 25%. The left ventricle has severely decreased function. The left  ventricle demonstrates global hypokinesis. Left ventricular diastolic parameters are consistent with Grade I diastolic dysfunction (impaired relaxation).  2. Right ventricular systolic function is normal. The right ventricular size is mildly enlarged.  3. A small pericardial effusion is present. The pericardial effusion is circumferential.  4. The mitral valve is normal in structure. Mild to moderate  mitral valve regurgitation. No evidence of mitral stenosis.  5. The aortic valve is normal in structure. Aortic valve regurgitation is not visualized. No aortic stenosis is present.  6. Hypodense mass in the liver suspected to be a hemagioma- may benefit from a dedicated ultrasound of the liver. The inferior vena cava is normal in size with greater than 50% respiratory variability, suggesting right atrial pressure of 3 mmHg. Comparison(s): No prior Echocardiogram. FINDINGS  Left Ventricle: Left ventricular ejection fraction, by estimation, is 20 to 25%. The left ventricle has severely decreased function. The left ventricle demonstrates global hypokinesis. Definity contrast agent was given IV to delineate the left ventricular endocardial borders. The left ventricular internal cavity size was normal in size. There is no left ventricular hypertrophy. Left ventricular diastolic parameters are consistent with Grade I diastolic dysfunction (impaired relaxation). Right Ventricle: The right ventricular size is mildly enlarged. No increase in right ventricular wall thickness. Right ventricular systolic function is normal. Left Atrium: Left atrial size was normal in size. Right Atrium: Right atrial size was normal in size. Pericardium: A small pericardial effusion is present. The pericardial effusion is circumferential. Presence of epicardial fat layer. Mitral Valve: The mitral valve is normal in structure. Mild to moderate mitral valve regurgitation. No evidence of mitral valve stenosis. Tricuspid Valve: The tricuspid valve is normal in structure. Tricuspid valve regurgitation is trivial. No evidence of tricuspid stenosis. Aortic Valve: The aortic valve is normal in structure. Aortic valve regurgitation is not visualized. No aortic stenosis is present. Aortic valve mean gradient measures 3.0 mmHg. Aortic valve peak gradient measures 4.7 mmHg. Aortic valve area, by VTI measures 3.94 cm. Pulmonic Valve: The pulmonic valve was  normal in structure. Pulmonic valve regurgitation is not visualized. No evidence of pulmonic stenosis. Aorta: The aortic root is normal in size and structure. Venous: Hypodense mass in the liver suspected to be a hemagioma- may benefit from a dedicated ultrasound of the liver. The inferior vena cava is normal in size with greater than 50% respiratory variability, suggesting right atrial pressure of 3 mmHg. IAS/Shunts: No atrial level shunt detected by color flow Doppler.  LEFT VENTRICLE PLAX 2D LVIDd:         6.30 cm      Diastology LVIDs:         5.80 cm      LV e' medial:    4.95 cm/s LV PW:         1.20 cm      LV E/e' medial:  12.5 LV IVS:        1.10 cm      LV e' lateral:   5.36 cm/s LVOT diam:     2.50 cm      LV E/e' lateral: 11.5 LV SV:         82 LV SV Index:   42 LVOT Area:     4.91 cm  LV Volumes (MOD) LV vol d, MOD A4C: 141.0 ml LV vol s, MOD A4C: 106.0 ml LV SV MOD A4C:     141.0 ml RIGHT VENTRICLE RV Basal diam:  3.10 cm RV Mid diam:    2.10 cm RV  S prime:     15.70 cm/s TAPSE (M-mode): 1.8 cm LEFT ATRIUM             Index        RIGHT ATRIUM           Index LA diam:        4.50 cm 2.28 cm/m   RA Area:     15.40 cm LA Vol (A2C):   31.3 ml 15.89 ml/m  RA Volume:   37.80 ml  19.19 ml/m LA Vol (A4C):   52.3 ml 26.55 ml/m LA Biplane Vol: 40.5 ml 20.56 ml/m  AORTIC VALVE                    PULMONIC VALVE AV Area (Vmax):    3.99 cm     PV Vmax:       1.01 m/s AV Area (Vmean):   3.66 cm     PV Vmean:      68.400 cm/s AV Area (VTI):     3.94 cm     PV VTI:        0.200 m AV Vmax:           108.00 cm/s  PV Peak grad:  4.1 mmHg AV Vmean:          76.000 cm/s  PV Mean grad:  2.0 mmHg AV VTI:            0.208 m AV Peak Grad:      4.7 mmHg AV Mean Grad:      3.0 mmHg LVOT Vmax:         87.80 cm/s LVOT Vmean:        56.600 cm/s LVOT VTI:          0.167 m LVOT/AV VTI ratio: 0.80  AORTA Ao Root diam: 3.80 cm Ao Asc diam:  3.50 cm MITRAL VALVE MV Area (PHT): 5.22 cm    SHUNTS MV Decel Time: 145 msec     Systemic VTI:  0.17 m MR Peak grad: 85.2 mmHg    Systemic Diam: 2.50 cm MR Mean grad: 49.0 mmHg MR Vmax:      461.50 cm/s MR Vmean:     328.0 cm/s MV E velocity: 61.70 cm/s MV A velocity: 61.70 cm/s MV E/A ratio:  1.00 Kardie Tobb DO Electronically signed by Berniece Salines DO Signature Date/Time: 10/14/2021/11:18:29 AM    Final     Disposition   Pt is being discharged home today in good condition.  Follow-up Plans & Appointments     Follow-up Information     [facility]. Go in 11 day(s).   Specialty: Cardiology Why: Hospital follow up PLEASE bring current medication list FREE valet parking, Entrance C, off of [employer] information: [address] [id number] [place]        Early Osmond, MD Follow up on 11/01/2021.   Specialty: Cardiology Why: at 8am for your follow up appt. Contact information: [place] [address] [phone]         [facility] [place] Follow up.   Specialty: Cardiothoracic Surgery Why: Office will call you with a follow up appt Contact information: [place], [place] [phone]               Discharge Instructions     Amb Referral to Cardiac Rehabilitation  Complete by: As directed    [facility]   Diagnosis: NSTEMI   After initial evaluation and assessments completed: Virtual Based Care may be provided alone or in conjunction with Phase 2 Cardiac Rehab based on patient barriers.: Yes   Call MD for:  difficulty breathing, headache or visual disturbances   Complete by: As directed    Call MD for:  persistant dizziness or light-headedness   Complete by: As directed    Call MD for:  redness, tenderness, or signs of infection (pain, swelling, redness, odor or green/yellow discharge around incision site)   Complete by: As  directed    Diet - low sodium heart healthy   Complete by: As directed    Discharge instructions   Complete by: As directed    Radial Site Care Refer to this sheet in the next few weeks. These instructions provide you with information on caring for yourself after your procedure. Your caregiver may also give you more specific instructions. Your treatment has been planned according to current medical practices, but problems sometimes occur. Call your caregiver if you have any problems or questions after your procedure. HOME CARE INSTRUCTIONS You may shower the day after the procedure. Remove the bandage (dressing) and gently wash the site with plain soap and water. Gently pat the site dry.  Do not apply powder or lotion to the site.  Do not submerge the affected site in water for 3 to 5 days.  Inspect the site at least twice daily.  Do not flex or bend the affected arm for 24 hours.  No lifting over 5 pounds (2.3 kg) for 5 days after your procedure.  Do not drive home if you are discharged the same day of the procedure. Have someone else drive you.  You may drive 24 hours after the procedure unless otherwise instructed by your caregiver.  What to expect: Any bruising will usually fade within 1 to 2 weeks.  Blood that collects in the tissue (hematoma) may be painful to the touch. It should usually decrease in size and tenderness within 1 to 2 weeks.  SEEK IMMEDIATE MEDICAL CARE IF: You have unusual pain at the radial site.  You have redness, warmth, swelling, or pain at the radial site.  You have drainage (other than a small amount of blood on the dressing).  You have chills.  You have a fever or persistent symptoms for more than 72 hours.  You have a fever and your symptoms suddenly get worse.  Your arm becomes pale, cool, tingly, or numb.  You have heavy bleeding from the site. Hold pressure on the site.   For patients with congestive heart failure, we give them these special  instructions:  1. Follow a low-salt diet and watch your fluid intake. In general, you should not be taking in more than 2 liters of fluid per day (no more than 8 glasses per day). Some patients are restricted to less than 1.5 liters of fluid per day (no more than 6 glasses per day). This includes sources of water in foods like soup, coffee, tea, milk, etc. 2. Weigh yourself on the same scale at same time of day and keep a log. 3. Call your doctor: (Anytime you feel any of the following symptoms)  - 3-4 pound weight gain in 1-2 days or 2 pounds overnight  - Shortness of breath, with or without a dry hacking cough  - Swelling in the hands, feet or stomach  - If you have to  sleep on extra pillows at night in order to breathe   IT IS IMPORTANT TO LET YOUR DOCTOR KNOW EARLY ON IF YOU ARE HAVING SYMPTOMS SO WE CAN HELP YOU!   Increase activity slowly   Complete by: As directed        Discharge Medications   Allergies as of 10/15/2021   No Known Allergies      Medication List     STOP taking these medications    lisinopril 2.5 MG tablet Commonly known as: ZESTRIL   pravastatin 20 MG tablet Commonly known as: PRAVACHOL       TAKE these medications    aspirin EC 81 MG tablet Take 1 tablet (81 mg total) by mouth daily.   atorvastatin 80 MG tablet Commonly known as: LIPITOR Take 1 tablet (80 mg total) by mouth daily. Start taking on: October 16, 2021   carvedilol 6.25 MG tablet Commonly known as: COREG Take 1 tablet by mouth twice daily   empagliflozin 10 MG Tabs tablet Commonly known as: JARDIANCE Take 1 tablet (10 mg total) by mouth daily. Start taking on: October 16, 2021   furosemide 20 MG tablet Commonly known as: LASIX Take 1 tablet by mouth once daily   glimepiride 2 MG tablet Commonly known as: AMARYL Take 2 mg by mouth daily with breakfast.   isosorbide mononitrate 30 MG 24 hr tablet Commonly known as: IMDUR Take 1 tablet (30 mg total) by mouth at bedtime.    losartan 25 MG tablet Commonly known as: COZAAR Take 0.5 tablets (12.5 mg total) by mouth at bedtime.   nitroGLYCERIN 0.4 MG SL tablet Commonly known as: NITROSTAT DISSOLVE ONE TABLET UNDER THE TONGUE EVERY 5 MINUTES AS NEEDED FOR CHEST PAIN.  DO NOT EXCEED A TOTAL OF 3 DOSES IN 15 MINUTES   omeprazole 20 MG capsule Commonly known as: PRILOSEC Take 20 mg by mouth daily.   potassium chloride SA 20 MEQ tablet Commonly known as: KLOR-CON M Take 1 tablet (20 mEq total) by mouth daily.   spironolactone 25 MG tablet Commonly known as: ALDACTONE Take 1 tablet by mouth once daily         Outstanding Labs/Studies   FLP/LFTs in 8 weeks  BMET at follow up appt  Duration of Discharge Encounter   Greater than 30 minutes including physician time.  Signed, Reino Bellis, NP 10/15/2021, 3:00 PM  ATTENDING ATTESTATION:   After conducting a review of all available clinical information with the care team, interviewing the patient, and performing a physical exam, I agree with the findings and plan described in this note.   GEN: No acute distress.   HEENT:  MMM, no JVD, no scleral icterus Cardiac: RRR, no murmurs, rubs, or gallops.  Respiratory: Clear to auscultation bilaterally. GI: Soft, nontender, non-distended  MS: No edema; No deformity. Neuro:  Nonfocal  Vasc:  +2 radial pulses   The patient remains chest pain-free.  I had a long conversation with the patient about options.  He was seen by Dr. Prescott Gum yesterday.  It sounds like the patient was quite adamantly against surgical revascularization for high SYNTAX score disease.  I did review his images at our heart team meeting today.  The consensus was that the patient would be best served by complete revascularization.  I have concerns about the viability of the anterior wall given echogenicity on echocardiography and Q waves anteriorly.  After my conversation with the patient it is clear that he is frustrated with being in the  hospital.  I spoke with him and his son-in-law and after our long discussion about potential treatment options he seems to be more willing to consider surgery but would like to be discharged on medical therapy and return back to speak with them.  I did have the opportunity speak with Dr. Prescott Gum about the patient.  He is willing to consider surgical revascularization as an outpatient after the patient has had a viability study to assess the anterior wall.  We will arrange the patient to see me in a few weeks time so I might discuss this strategy with him.     Lenna Sciara, MD Pager [phone]

## 2021-10-15 NOTE — Progress Notes (Addendum)
Progress Note  Patient Name: Frank Swanson Date of Encounter: 10/15/2021  [facility] Cardiologist: Shirlee More, MD   Subjective   No chest pain. Wanting to go home today.   Inpatient Medications    Scheduled Meds:  aspirin  324 mg Oral Once   aspirin EC  81 mg Oral Daily   atorvastatin  80 mg Oral Daily   carvedilol  25 mg Oral BID   empagliflozin  10 mg Oral Daily   insulin aspart  0-9 Units Subcutaneous TID WC   losartan  25 mg Oral QHS   sodium chloride flush  3 mL Intravenous Q12H   sodium chloride flush  3 mL Intravenous Q12H   spironolactone  25 mg Oral q1800   Continuous Infusions:  sodium chloride 50 mL/hr at 10/14/21 0600   sodium chloride     heparin 1,200 Units/hr (10/14/21 2308)   PRN Meds: sodium chloride, acetaminophen, nitroGLYCERIN, ondansetron (ZOFRAN) IV, sodium chloride flush   Vital Signs    Vitals:   10/14/21 1220 10/14/21 1650 10/14/21 2100 10/15/21 0440  BP: 107/76 125/67 104/60 111/64  Pulse: 70 77 72 64  Resp: 17  18 18   Temp: (!) 97.3 F (36.3 C) 97.9 F (36.6 C) 98.7 F (37.1 C) 98.6 F (37 C)  TempSrc: Oral Oral Oral Oral  SpO2: 94% 91% 93% 95%  Weight:      Height:        Intake/Output Summary (Last 24 hours) at 10/15/2021 0748 Last data filed at 10/15/2021 0400 Gross per 24 hour  Intake 389.69 ml  Output 725 ml  Net -335.31 ml      10/14/2021    4:24 AM 10/13/2021    1:06 PM 10/13/2021   11:45 AM  Last 3 Weights  Weight (lbs) 178 lb 12.8 oz 179 lb 179 lb  Weight (kg) 81.103 kg 81.194 kg 81.194 kg      Telemetry    Sinus Rhythm, PVCs - Personally Reviewed  ECG    No new tracing  Physical Exam   GEN: No acute distress.   Neck: No JVD Cardiac: RRR, no murmurs, rubs, or gallops.  Respiratory: Clear to auscultation bilaterally. GI: Soft, nontender, non-distended  MS: No edema; No deformity. Right radial cath site stable.  Neuro:  Nonfocal  Psych: Normal affect   Labs    High Sensitivity Troponin:   Recent  Labs  Lab 10/13/21 1315 10/13/21 1619  TROPONINIHS 666* 1,442*     Chemistry Recent Labs  Lab 10/13/21 1315 10/14/21 0803 10/15/21 0104  NA 134* 137 133*  K 3.5 3.8 3.8  CL 104 107 105  CO2 22 21* 20*  GLUCOSE 118* 82 74  BUN 12 14 20   CREATININE 1.09 1.22 1.23  CALCIUM 8.6* 8.6* 8.4*  PROT 6.3*  --   --   ALBUMIN 3.3*  --   --   AST 27  --   --   ALT 15  --   --   ALKPHOS 54  --   --   BILITOT 1.1  --   --   GFRNONAA >60 >60 >60  ANIONGAP 8 9 8     Lipids  Recent Labs  Lab 10/14/21 0803  CHOL 140  TRIG 65  HDL 33*  LDLCALC 94  CHOLHDL 4.2    Hematology Recent Labs  Lab 10/13/21 1315 10/14/21 0803 10/15/21 0104  WBC 6.4 6.1 6.4  RBC 4.51 4.44 4.13*  HGB 13.8 13.7 12.4*  HCT 40.1 38.9* 36.9*  MCV 88.9 87.6 89.3  MCH 30.6 30.9 30.0  MCHC 34.4 35.2 33.6  RDW 11.9 11.9 12.0  PLT 189 175 183   Thyroid No results for input(s): TSH, FREET4 in the last 168 hours.  BNPNo results for input(s): BNP, PROBNP in the last 168 hours.  DDimer No results for input(s): DDIMER in the last 168 hours.   Radiology    CARDIAC CATHETERIZATION  Result Date: 10/14/2021   Ost LM to Mid LM lesion is 40% stenosed.   Prox LAD lesion is 90% stenosed.   Ost Cx to Prox Cx lesion is 80% stenosed.   1st Mrg lesion is 60% stenosed.   Mid Cx to Dist Cx lesion is 75% stenosed.   Prox RCA to Mid RCA lesion is 100% stenosed.   LV end diastolic pressure is normal. Severe 3 vessel obstructive CAD Severely calcified coronary arteries. Low LV EDP 5 mm Hg Plan: need CT surgery consultation for CABG.  DG Chest Portable 1 View  Result Date: 10/13/2021 CLINICAL DATA:  Chest pain. EXAM: PORTABLE CHEST 1 VIEW COMPARISON:  Prior chest radiographs 02/29/2012 and earlier. FINDINGS: Heart size within normal limits. Aortic atherosclerosis. No appreciable airspace consolidation or pulmonary edema. No evidence of pleural effusion or pneumothorax. No acute bony abnormality identified. IMPRESSION: No evidence  of acute cardiopulmonary abnormality. Aortic Atherosclerosis (ICD10-I70.0). Electronically Signed   By: Kellie Simmering D.O.   On: 10/13/2021 13:27   MYOCARDIAL PERFUSION IMAGING  Result Date: 10/14/2021   Findings are consistent with ischemia in LAD territory. The study is high risk.   No ST deviation was noted.   Left ventricular function is abnormal. Global function is severely reduced. Nuclear stress EF: 18 %. The left ventricular ejection fraction is severely decreased (<30%). End diastolic cavity size is moderately enlarged.   Prior study not available for comparison.   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 Ischemia involving apical, mid and basal portion of atnerior and antero-septal wall. Pt developed prolonged episode of CP after stress test and was transported to the ER.   ECHOCARDIOGRAM COMPLETE  Result Date: 10/14/2021    ECHOCARDIOGRAM REPORT   Patient Name:   Frank Swanson Date of Exam: 10/14/2021 Medical Rec #:  [id number]    Height:       69.0 in Accession #:    [id number]   Weight:       178.8 lb Date of Birth:  23-Feb-1946    BSA:          1.970 m Patient Age:    76 years     BP:           108/64 mmHg Patient Gender: M            HR:           79 bpm. Exam Location:  Inpatient Procedure: 2D Echo, Cardiac Doppler, Color Doppler and Intracardiac            Opacification Agent Indications:    NSTEMI  History:        Patient has no prior history of Echocardiogram examinations.                 Cardiomyopathy and CHF; Risk Factors:Dyslipidemia, Hypertension                 and Diabetes.  Sonographer:    Luisa Hart RDCS Referring Phys: [phone] [facility]  Sonographer Comments: Technically difficult study  due to poor echo windows. IMPRESSIONS  1. Left ventricular ejection fraction, by estimation, is 20 to 25%. The left ventricle has severely decreased function. The left ventricle demonstrates global hypokinesis. Left  ventricular diastolic parameters are consistent with Grade I diastolic dysfunction (impaired relaxation).  2. Right ventricular systolic function is normal. The right ventricular size is mildly enlarged.  3. A small pericardial effusion is present. The pericardial effusion is circumferential.  4. The mitral valve is normal in structure. Mild to moderate mitral valve regurgitation. No evidence of mitral stenosis.  5. The aortic valve is normal in structure. Aortic valve regurgitation is not visualized. No aortic stenosis is present.  6. Hypodense mass in the liver suspected to be a hemagioma- may benefit from a dedicated ultrasound of the liver. The inferior vena cava is normal in size with greater than 50% respiratory variability, suggesting right atrial pressure of 3 mmHg. Comparison(s): No prior Echocardiogram. FINDINGS  Left Ventricle: Left ventricular ejection fraction, by estimation, is 20 to 25%. The left ventricle has severely decreased function. The left ventricle demonstrates global hypokinesis. Definity contrast agent was given IV to delineate the left ventricular endocardial borders. The left ventricular internal cavity size was normal in size. There is no left ventricular hypertrophy. Left ventricular diastolic parameters are consistent with Grade I diastolic dysfunction (impaired relaxation). Right Ventricle: The right ventricular size is mildly enlarged. No increase in right ventricular wall thickness. Right ventricular systolic function is normal. Left Atrium: Left atrial size was normal in size. Right Atrium: Right atrial size was normal in size. Pericardium: A small pericardial effusion is present. The pericardial effusion is circumferential. Presence of epicardial fat layer. Mitral Valve: The mitral valve is normal in structure. Mild to moderate mitral valve regurgitation. No evidence of mitral valve stenosis. Tricuspid Valve: The tricuspid valve is normal in structure. Tricuspid valve  regurgitation is trivial. No evidence of tricuspid stenosis. Aortic Valve: The aortic valve is normal in structure. Aortic valve regurgitation is not visualized. No aortic stenosis is present. Aortic valve mean gradient measures 3.0 mmHg. Aortic valve peak gradient measures 4.7 mmHg. Aortic valve area, by VTI measures 3.94 cm. Pulmonic Valve: The pulmonic valve was normal in structure. Pulmonic valve regurgitation is not visualized. No evidence of pulmonic stenosis. Aorta: The aortic root is normal in size and structure. Venous: Hypodense mass in the liver suspected to be a hemagioma- may benefit from a dedicated ultrasound of the liver. The inferior vena cava is normal in size with greater than 50% respiratory variability, suggesting right atrial pressure of 3 mmHg. IAS/Shunts: No atrial level shunt detected by color flow Doppler.  LEFT VENTRICLE PLAX 2D LVIDd:         6.30 cm      Diastology LVIDs:         5.80 cm      LV e' medial:    4.95 cm/s LV PW:         1.20 cm      LV E/e' medial:  12.5 LV IVS:        1.10 cm      LV e' lateral:   5.36 cm/s LVOT diam:     2.50 cm      LV E/e' lateral: 11.5 LV SV:         82 LV SV Index:   42 LVOT Area:     4.91 cm  LV Volumes (MOD) LV vol d, MOD A4C: 141.0 ml LV vol s, MOD A4C: 106.0 ml  LV SV MOD A4C:     141.0 ml RIGHT VENTRICLE RV Basal diam:  3.10 cm RV Mid diam:    2.10 cm RV S prime:     15.70 cm/s TAPSE (M-mode): 1.8 cm LEFT ATRIUM             Index        RIGHT ATRIUM           Index LA diam:        4.50 cm 2.28 cm/m   RA Area:     15.40 cm LA Vol (A2C):   31.3 ml 15.89 ml/m  RA Volume:   37.80 ml  19.19 ml/m LA Vol (A4C):   52.3 ml 26.55 ml/m LA Biplane Vol: 40.5 ml 20.56 ml/m  AORTIC VALVE                    PULMONIC VALVE AV Area (Vmax):    3.99 cm     PV Vmax:       1.01 m/s AV Area (Vmean):   3.66 cm     PV Vmean:      68.400 cm/s AV Area (VTI):     3.94 cm     PV VTI:        0.200 m AV Vmax:           108.00 cm/s  PV Peak grad:  4.1 mmHg AV Vmean:           76.000 cm/s  PV Mean grad:  2.0 mmHg AV VTI:            0.208 m AV Peak Grad:      4.7 mmHg AV Mean Grad:      3.0 mmHg LVOT Vmax:         87.80 cm/s LVOT Vmean:        56.600 cm/s LVOT VTI:          0.167 m LVOT/AV VTI ratio: 0.80  AORTA Ao Root diam: 3.80 cm Ao Asc diam:  3.50 cm MITRAL VALVE MV Area (PHT): 5.22 cm    SHUNTS MV Decel Time: 145 msec    Systemic VTI:  0.17 m MR Peak grad: 85.2 mmHg    Systemic Diam: 2.50 cm MR Mean grad: 49.0 mmHg MR Vmax:      461.50 cm/s MR Vmean:     328.0 cm/s MV E velocity: 61.70 cm/s MV A velocity: 61.70 cm/s MV E/A ratio:  1.00 Kardie Tobb DO Electronically signed by Berniece Salines DO Signature Date/Time: 10/14/2021/11:18:29 AM    Final     Cardiac Studies   Cath: 10/14/21      Ost LM to Mid LM lesion is 40% stenosed.   Prox LAD lesion is 90% stenosed.   Ost Cx to Prox Cx lesion is 80% stenosed.   1st Mrg lesion is 60% stenosed.   Mid Cx to Dist Cx lesion is 75% stenosed.   Prox RCA to Mid RCA lesion is 100% stenosed.   LV end diastolic pressure is normal.   Severe 3 vessel obstructive CAD Severely calcified coronary arteries. Low LV EDP 5 mm Hg   Plan: need CT surgery consultation for CABG.  Diagnostic Dominance: Right  Echo: 10/14/21  IMPRESSIONS     1. Left ventricular ejection fraction, by estimation, is 20 to 25%. The  left ventricle has severely decreased function. The left ventricle  demonstrates global hypokinesis. Left ventricular diastolic parameters are  consistent with Grade I diastolic  dysfunction (impaired relaxation).   2. Right ventricular systolic function is normal. The right ventricular  size is mildly enlarged.   3. A small pericardial effusion is present. The pericardial effusion is  circumferential.   4. The mitral valve is normal in structure. Mild to moderate mitral valve  regurgitation. No evidence of mitral stenosis.   5. The aortic valve is normal in structure. Aortic valve regurgitation is  not visualized.  No aortic stenosis is present.   6. Hypodense mass in the liver suspected to be a hemagioma- may benefit  from a dedicated ultrasound of the liver. The inferior vena cava is normal  in size with greater than 50% respiratory variability, suggesting right  atrial pressure of 3 mmHg.   Comparison(s): No prior Echocardiogram.   FINDINGS   Left Ventricle: Left ventricular ejection fraction, by estimation, is 20  to 25%. The left ventricle has severely decreased function. The left  ventricle demonstrates global hypokinesis. Definity contrast agent was  given IV to delineate the left  ventricular endocardial borders. The left ventricular internal cavity size  was normal in size. There is no left ventricular hypertrophy. Left  ventricular diastolic parameters are consistent with Grade I diastolic  dysfunction (impaired relaxation).   Right Ventricle: The right ventricular size is mildly enlarged. No  increase in right ventricular wall thickness. Right ventricular systolic  function is normal.   Left Atrium: Left atrial size was normal in size.   Right Atrium: Right atrial size was normal in size.   Pericardium: A small pericardial effusion is present. The pericardial  effusion is circumferential. Presence of epicardial fat layer.   Mitral Valve: The mitral valve is normal in structure. Mild to moderate  mitral valve regurgitation. No evidence of mitral valve stenosis.   Tricuspid Valve: The tricuspid valve is normal in structure. Tricuspid  valve regurgitation is trivial. No evidence of tricuspid stenosis.   Aortic Valve: The aortic valve is normal in structure. Aortic valve  regurgitation is not visualized. No aortic stenosis is present. Aortic  valve mean gradient measures 3.0 mmHg. Aortic valve peak gradient measures  4.7 mmHg. Aortic valve area, by VTI  measures 3.94 cm.   Pulmonic Valve: The pulmonic valve was normal in structure. Pulmonic valve  regurgitation is not  visualized. No evidence of pulmonic stenosis.   Aorta: The aortic root is normal in size and structure.   Venous: Hypodense mass in the liver suspected to be a hemagioma- may  benefit from a dedicated ultrasound of the liver. The inferior vena cava  is normal in size with greater than 50% respiratory variability,  suggesting right atrial pressure of 3 mmHg.   IAS/Shunts: No atrial level shunt detected by color flow Doppler.   Patient Profile     76 y.o. 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  Assessment & Plan    Chest pain/abnormal stress test/NSTEMI: Underwent outpatient Lexiscan Myoview today which was abnormal with concern for ischemia within the LAD territory.  Patient also had significant chest discomfort during and after testing requiring aminophylline and nitroglycerin.  Was pain free on arrival. Initial troponin [phone]. Underwent cardiac cath noted above with severe multivessel disease, including 100% pRCA, 80% ostial Lcx, and 90% pLAD, 40% LM. He was seen by [facility], Dr. Darcey Nora and deemed not a candidate for CABG. Not sure if appropriate for PCI options. No plans for intervention today per  MD -- Continue IV heparin, aspirin, statin, Coreg, and Arlyce Harman -- will ask CR to see today   HFrEF: Known LVEF of 40-45%, lexiscan myoview reported EF of 18%. No volume overload noted on exam. LVEDP on cath 5. LVEF on echo 20-25% with g1DD, mildly enlarged RV, small pericardial effusion, mild to moderate MR -- continue coreg, spiro, losartan and jardiance. Blood pressures remain borderline soft, do not think he would tolerate Entresto at this time   HTN: stable -- continue coreg, spiro, losartan    HLD: has been on pravastatin -- LDL 94 -- switched to atorvastatin 80mg  daily this admission -- FLP/LFTs in 8 weeks    DM: hold home glipizide, Hgb A1c 5.5 -- SSI while  inpatient  -- started on jardiance    For questions or updates, please contact [place] Please consult [email or web address] for contact info under        Signed, Reino Bellis, NP  10/15/2021, 7:48 AM    ATTENDING ATTESTATION:  After conducting a review of all available clinical information with the care team, interviewing the patient, and performing a physical exam, I agree with the findings and plan described in this note.   GEN: No acute distress.   HEENT:  MMM, no JVD, no scleral icterus Cardiac: RRR, no murmurs, rubs, or gallops.  Respiratory: Clear to auscultation bilaterally. GI: Soft, nontender, non-distended  MS: No edema; No deformity. Neuro:  Nonfocal  Vasc:  +2 radial pulses  The patient remains chest pain-free.  I had a long conversation with the patient about options.  He was seen by Dr. Prescott Gum yesterday.  It sounds like the patient was quite adamantly against surgical revascularization for high SYNTAX score disease.  I did review his images at our heart team meeting today.  The consensus was that the patient would be best served by complete revascularization.  I have concerns about the viability of the anterior wall given echogenicity on echocardiography and Q waves anteriorly.  After my conversation with the patient it is clear that he is frustrated with being in the hospital.  I spoke with him and his son-in-law and after our long discussion about potential treatment options he seems to be more willing to consider surgery but would like to be discharged on medical therapy and return back to speak with them.  I will reach out to Dr. Prescott Gum today to discuss further.  Depending on this conversation I will have the patient perhaps see cardiothoracic surgery as an outpatient.  After speaking with me about his various revascularization options and understanding that PCI given his high SYNTAX score disease would be suboptimal, and he is more willing to consider and perhaps  undergo surgical revascularization.  We will discharge the patient today with follow-up with Dr. Bettina Gavia or here locally and I will arrange follow-up with cardiothoracic surgery following my discussion with Dr. Prescott Gum.   Lenna Sciara, MD Pager [phone]

## 2021-10-18 ENCOUNTER — Encounter (HOSPITAL_COMMUNITY): Payer: Medicare Other

## 2021-10-19 LAB — LIPOPROTEIN A (LPA): Lipoprotein (a): 61.5 nmol/L — ABNORMAL HIGH (ref <75.0)

## 2021-10-26 ENCOUNTER — Other Ambulatory Visit: Payer: Self-pay | Admitting: Cardiology

## 2021-10-26 NOTE — Telephone Encounter (Signed)
Spironolactone 25 mg # 90 x 3 refills Furosemide 20 mg # 90 x 3 refills Carvedilol 6.25 mg # 180 x 3 refills Sent to  [facility] - [place], [place] - [address]

## 2021-10-28 NOTE — Progress Notes (Incomplete)
HEART & VASCULAR TRANSITION OF CARE CONSULT NOTE     Referring Physician: Dr Geralynn Rile  Primary Care: Primary Cardiologist:  HPI: Referred to clinic by Dr Geralynn Rile for heart failure consultation.   Frank Swanson is a 76 y.o. male with HFrEF (EF 40 to 45%), CKD stage 2, hyperlipidemia, hypertension, and DMII.   Transferred from [facility] after abnormal stress test concerning for ischemia. Admitted 10/13/21 with chest pain in the setting NSTEMI. Cath with 3 vessel obstructive CAD. CT surgery consulted. He was seen by TCTS, Dr. Maren Beach and refused CABG,also deemed not a candidate for CABG. There was also concern regarding viability of the anterior wall. Long discussion with Dr. Lynnette Caffey and patient, decision was made to DC home with outpatient follow up in the office as well as TCTS with Dr. Maren Beach.   Today he returns for HF follow up.Overall feeling fine. Denies SOB/PND/Orthopnea. Appetite ok. No fever or chills. Weight at home  pounds. Taking all medications    Cardiac Testing  10/14/21 Echo Ef 20-25%   Cath 10/14/21 Ost LM to Mid LM lesion is 40% stenosed.   Prox LAD lesion is 90% stenosed.   Ost Cx to Prox Cx lesion is 80% stenosed.   1st Mrg lesion is 60% stenosed.   Mid Cx to Dist Cx lesion is 75% stenosed.   Prox RCA to Mid RCA lesion is 100% stenosed.   LV end diastolic pressure is normal.  Severe 3 vessel obstructive CAD Severely calcified coronary arteries. Low LV EDP 5 mm Hg Review of Systems: [y] = yes, [ ]  = no   General: Weight gain [ ] ; Weight loss [ ] ; Anorexia [ ] ; Fatigue [ ] ; Fever [ ] ; Chills [ ] ; Weakness [ ]   Cardiac: Chest pain/pressure [ ] ; Resting SOB [ ] ; Exertional SOB [ ] ; Orthopnea [ ] ; Pedal Edema [ ] ; Palpitations [ ] ; Syncope [ ] ; Presyncope [ ] ; Paroxysmal nocturnal dyspnea[ ]   Pulmonary: Cough [ ] ; Wheezing[ ] ; Hemoptysis[ ] ; Sputum [ ] ; Snoring [ ]   GI: Vomiting[ ] ; Dysphagia[ ] ; Melena[ ] ; Hematochezia [ ] ; Heartburn[ ] ; Abdominal pain [  ]; Constipation [ ] ; Diarrhea [ ] ; BRBPR [ ]   GU: Hematuria[ ] ; Dysuria [ ] ; Nocturia[ ]   Vascular: Pain in legs with walking [ ] ; Pain in feet with lying flat [ ] ; Non-healing sores [ ] ; Stroke [ ] ; TIA [ ] ; Slurred speech [ ] ;  Neuro: Headaches[ ] ; Vertigo[ ] ; Seizures[ ] ; Paresthesias[ ] ;Blurred vision [ ] ; Diplopia [ ] ; Vision changes [ ]   Ortho/Skin: Arthritis [ ] ; Joint pain [ ] ; Muscle pain [ ] ; Joint swelling [ ] ; Back Pain [ ] ; Rash [ ]   Psych: Depression[ ] ; Anxiety[ ]   Heme: Bleeding problems [ ] ; Clotting disorders [ ] ; Anemia [ ]   Endocrine: Diabetes [ ] ; Thyroid dysfunction[ ]    Past Medical History:  Diagnosis Date   Cardiomyopathy (HCC) 05/06/2015   Chronic combined systolic and diastolic heart failure (HCC) 05/06/2015   Chronic kidney disease, stage II (mild) 05/06/2015   Chronic systolic heart failure (HCC) 05/06/2015   CKD (chronic kidney disease) 05/06/2015   High risk medication use 05/06/2015   Overview:  Digoxin   Hyperlipidemia 05/06/2015   Hypertensive heart disease with heart failure (HCC) 05/02/2017   Mild CAD 05/06/2015   Type 2 diabetes mellitus without complication, without long-term current use of insulin (HCC) 03/17/2017    Current Outpatient Medications  Medication Sig Dispense Refill   aspirin EC 81 MG tablet  Take 1 tablet (81 mg total) by mouth daily. 90 tablet 3   atorvastatin (LIPITOR) 80 MG tablet Take 1 tablet (80 mg total) by mouth daily. 90 tablet 0   carvedilol (COREG) 6.25 MG tablet Take 1 tablet by mouth twice daily 180 tablet 3   empagliflozin (JARDIANCE) 10 MG TABS tablet Take 1 tablet (10 mg total) by mouth daily. 90 tablet 0   furosemide (LASIX) 20 MG tablet Take 1 tablet by mouth once daily 90 tablet 3   glimepiride (AMARYL) 2 MG tablet Take 2 mg by mouth daily with breakfast.     isosorbide mononitrate (IMDUR) 30 MG 24 hr tablet Take 1 tablet (30 mg total) by mouth at bedtime. 90 tablet 0   losartan (COZAAR) 25 MG tablet Take 0.5  tablets (12.5 mg total) by mouth at bedtime. 15 tablet 2   nitroGLYCERIN (NITROSTAT) 0.4 MG SL tablet DISSOLVE ONE TABLET UNDER THE TONGUE EVERY 5 MINUTES AS NEEDED FOR CHEST PAIN.  DO NOT EXCEED A TOTAL OF 3 DOSES IN 15 MINUTES 25 tablet 7   omeprazole (PRILOSEC) 20 MG capsule Take 20 mg by mouth daily.     potassium chloride SA (KLOR-CON M) 20 MEQ tablet Take 1 tablet (20 mEq total) by mouth daily. 90 tablet 1   spironolactone (ALDACTONE) 25 MG tablet Take 1 tablet by mouth once daily 90 tablet 3   No current facility-administered medications for this visit.    No Known Allergies    Social History   Socioeconomic History   Marital status: Widowed    Spouse name: Not on file   Number of children: 0   Years of education: Not on file   Highest education level: 9th grade  Occupational History    Comment: Retired  Tobacco Use   Smoking status: Every Day    Packs/day: 1.00    Years: 65.00    Total pack years: 65.00    Types: Cigarettes   Smokeless tobacco: Never  Vaping Use   Vaping Use: Never used  Substance and Sexual Activity   Alcohol use: No   Drug use: No   Sexual activity: Not on file  Other Topics Concern   Not on file  Social History Narrative   Not on file   Social Determinants of Health   Financial Resource Strain: Low Risk  (10/15/2021)   Overall Financial Resource Strain (CARDIA)    Difficulty of Paying Living Expenses: Not very hard  Food Insecurity: No Food Insecurity (10/15/2021)   Hunger Vital Sign    Worried About Running Out of Food in the Last Year: Never true    Ran Out of Food in the Last Year: Never true  Transportation Needs: No Transportation Needs (10/15/2021)   PRAPARE - Administrator, Civil Service (Medical): No    Lack of Transportation (Non-Medical): No  Physical Activity: Not on file  Stress: Not on file  Social Connections: Not on file  Intimate Partner Violence: Not on file      Family History  Problem Relation Age of  Onset   Heart failure Mother    Cancer Father     There were no vitals filed for this visit.  PHYSICAL EXAM: General:  Well appearing. No respiratory difficulty HEENT: normal Neck: supple. no JVD. Carotids 2+ bilat; no bruits. No lymphadenopathy or thryomegaly appreciated. Cor: PMI nondisplaced. Regular rate & rhythm. No rubs, gallops or murmurs. Lungs: clear Abdomen: soft, nontender, nondistended. No hepatosplenomegaly. No bruits or masses. Good  bowel sounds. Extremities: no cyanosis, clubbing, rash, edema Neuro: alert & oriented x 3, cranial nerves grossly intact. moves all 4 extremities w/o difficulty. Affect pleasant.  ECG:   ASSESSMENT & PLAN: 1. HFrEf NYHA *** GDMT  Diuretic- BB- Ace/ARB/ARNI MRA SGLT2i  2. CAD      Referred to HFSW (PCP, Medications, Transportation, ETOH Abuse, Drug Abuse, Insurance, Financial ): Yes or No Refer to Pharmacy: Yes or No Refer to Home Health: Yes on No Refer to Advanced Heart Failure Clinic: Yes or no  Refer to General Cardiology: Yes or No  Follow up

## 2021-10-29 ENCOUNTER — Encounter (HOSPITAL_COMMUNITY): Payer: Medicare Other

## 2021-10-29 ENCOUNTER — Telehealth (HOSPITAL_COMMUNITY): Payer: Self-pay | Admitting: *Deleted

## 2021-10-29 NOTE — Telephone Encounter (Signed)
Heart Failure Nurse Navigator Progress Note   Mailbox full, unable to leave appointment reminder message for 9 am on 10/29/21.   Rhae Hammock, BSN, Scientist, clinical (histocompatibility and immunogenetics) Only

## 2021-11-01 ENCOUNTER — Ambulatory Visit (INDEPENDENT_AMBULATORY_CARE_PROVIDER_SITE_OTHER): Payer: Medicare Other | Admitting: Internal Medicine

## 2021-11-01 ENCOUNTER — Telehealth: Payer: Self-pay | Admitting: Internal Medicine

## 2021-11-01 DIAGNOSIS — I255 Ischemic cardiomyopathy: Secondary | ICD-10-CM

## 2021-11-01 DIAGNOSIS — I152 Hypertension secondary to endocrine disorders: Secondary | ICD-10-CM

## 2021-11-01 DIAGNOSIS — I25118 Atherosclerotic heart disease of native coronary artery with other forms of angina pectoris: Secondary | ICD-10-CM

## 2021-11-01 DIAGNOSIS — I700 Atherosclerosis of aorta: Secondary | ICD-10-CM

## 2021-11-01 DIAGNOSIS — E785 Hyperlipidemia, unspecified: Secondary | ICD-10-CM

## 2021-11-01 DIAGNOSIS — E119 Type 2 diabetes mellitus without complications: Secondary | ICD-10-CM

## 2021-11-01 DIAGNOSIS — E1169 Type 2 diabetes mellitus with other specified complication: Secondary | ICD-10-CM

## 2021-11-01 DIAGNOSIS — E1159 Type 2 diabetes mellitus with other circulatory complications: Secondary | ICD-10-CM

## 2021-11-01 NOTE — Progress Notes (Signed)
Cardiology Office Note:    Date:  11/01/2021   ID:  Frank Swanson, DOB August 31, 1945, MRN [id number]  PCP:  Patient, Frank Swanson   [facility] Providers Cardiologist:  Alverda Skeans, MD Referring MD: Frank ref. provider found   Chief Complaint/Reason for Referral: Multivessel coronary artery disease; hospital follow up  PATIENT DID NOT APPEAR FOR SCHEDULED APPOINTMENT  ASSESSMENT:    1. Coronary artery disease of native artery of native heart with stable angina pectoris (HCC)   2. Type 2 diabetes mellitus without complication, without long-term current use of insulin (HCC)   3. Hypertension associated with diabetes (HCC)   4. Hyperlipidemia associated with type 2 diabetes mellitus (HCC)   5. Aortic atherosclerosis (HCC)   6. Ischemic cardiomyopathy     PLAN:    In order of problems listed above: 1.  Coronary artery disease: I 2.  Type 2 diabetes:  3.  Hypertension: 4.  Hyperlipidemia:  5.  Aortic atherosclerosis:  6.  Ischemic cardiomyopathy:        History of Present Illness:    FOCUSED PROBLEM LIST:   1.  Multivessel coronary artery disease consisting of 40% left main, 90% proximal LAD, 80% ostial circumflex, and chronic total occlusion of right coronary artery (high syntax score) 2.  Ischemic cardiomyopathy with an ejection fraction of 20 to 25% 3.  Hypertension 4.  Hyperlipidemia 5.  Type 2 diabetes on oral medication 6.  Tobacco abuse 7.  Aortic atherosclerosis on chest x-ray May 2023  The patient is a 76 y.o. male with the indicated medical history here for hospital follow-up.  He was admitted recently after a grossly abnormal stress test and worsening angina.  He was found to have multivessel disease.  He was very adamantly against surgery mostly because he was frustrated being in the hospital.  He was seen by cardiothoracic surgery and turned down.  I did have a discussion with the patient and he seemed more amenable to surgical revascularization if he were able to  go home and get out of the hospital.  He is here to follow-up.          Current Medications: Frank outpatient medications have been marked as taking for the 11/01/21 encounter (Appointment) with Frank Pyo, MD.     Allergies:    Patient has Frank known allergies.   Social History:   Social History   Tobacco Use   Smoking status: Every Day    Packs/day: 1.00    Years: 65.00    Total pack years: 65.00    Types: Cigarettes   Smokeless tobacco: Never  Vaping Use   Vaping Use: Never used  Substance Use Topics   Alcohol use: Frank   Drug use: Frank     Family Hx: Family History  Problem Relation Age of Onset   Heart failure Mother    Cancer Father      Review of Systems:   Please see the history of present illness.    All other systems reviewed and are negative.     EKGs/Labs/Other Test Reviewed:    EKG:  EKG performed June 2023 that I personally reviewed demonstrates sinus rhythm with anterior Q waves and T wave inversions laterally; EKG performed today that I personally reviewed demonstrates .  Prior CV studies:  Cardiac catheterization June 2023:   Ost LM to Mid LM lesion is 40% stenosed.   Prox LAD lesion is 90% stenosed.   Ost Cx to Prox Cx lesion is 80% stenosed.  1st Mrg lesion is 60% stenosed.   Mid Cx to Dist Cx lesion is 75% stenosed.   Prox RCA to Mid RCA lesion is 100% stenosed.   LV end diastolic pressure is normal.   Severe 3 vessel obstructive CAD Severely calcified coronary arteries. Low LV EDP 5 mm Hg  TTE June 2023: Ejection fraction 20 to 25% with normal RV function and mild to moderate mitral regurgitation  Other studies Reviewed: Review of the additional studies/records demonstrates: Chest x-ray demonstrates aortic atherosclerosis  Recent Labs: 10/13/2021: ALT 15 10/15/2021: BUN 20; Creatinine, Ser 1.23; Hemoglobin 12.4; Platelets 183; Potassium 3.8; Sodium 133   Recent Lipid Panel Lab Results  Component Value Date/Time   CHOL 140  10/14/2021 08:03 AM   CHOL 156 03/30/2021 10:11 AM   TRIG 65 10/14/2021 08:03 AM   HDL 33 (L) 10/14/2021 08:03 AM   HDL 41 03/30/2021 10:11 AM   LDLCALC 94 10/14/2021 08:03 AM   LDLCALC 81 03/30/2021 10:11 AM      Signed, Frank Pyo, MD  11/01/2021 7:09 AM    [facility] [address] [place], [place], [place]  [address] Phone: [phone]; Fax: [phone]   Note:  This document was prepared using Dragon voice recognition software and may include unintentional dictation errors.

## 2021-11-01 NOTE — Telephone Encounter (Signed)
Patient did not appear for scheduled follow-up appointment regarding severe multivessel disease, ischemic cardiomyopathy with ejection fraction of 20 to 25%, and type 2 diabetes.  I did try calling the patient and his voicemail was full.  I called his second contact which is his sister and her voicemail was full.  His third contact which is his stepson Randy's voicemail was available and a message was left to have the patient contact us to arrange for follow-up.  He will ultimately need a viability study and to see cardiothoracic surgery again.

## 2021-11-15 ENCOUNTER — Other Ambulatory Visit: Payer: Self-pay | Admitting: Cardiology

## 2021-11-22 ENCOUNTER — Ambulatory Visit: Payer: Medicare Other | Admitting: Cardiothoracic Surgery

## 2021-11-29 ENCOUNTER — Encounter: Payer: Medicare Other | Admitting: Cardiothoracic Surgery

## 2021-11-29 ENCOUNTER — Telehealth: Payer: Self-pay

## 2021-11-29 NOTE — Telephone Encounter (Signed)
-----   Message from Georgeanna Lea, MD sent at 11/27/2021 12:55 PM EDT ----- Can you schedule him to see me and see maybe we will be able to convince him to do what is needed it to be seen within next 2 to 3 weeks ----- Message ----- From: Orbie Pyo, MD Sent: 11/26/2021  11:40 AM EDT To: Georgeanna Lea, MD  Hi Robert,      I just wanted to let you know that this patient who had MVD on a cath a few months ago and needs surgery continues to refuse to come in for evaluation.  We reviewed his cath for possible PCI and the consensus opinion was that PCI should not be offered and the patient should get surgery.  We had him scheduled to see me after discharge for close follow up and he did not appear.  It looks like you had seen him in May prior to his hospitalization.  Maybe he will follow up with you since it would be more local to him.  FYI.  Thanks, Alverda Skeans   ----- Message ----- From: Davina Poke, RN Sent: 11/26/2021  10:18 AM EDT To: Ludwig Clarks, RN; Joycelyn Schmid, LPN; #  Dr. Lynnette Caffey,   Just a heads up, this patient was scheduled to see Dr. Donata Clay on Monday for CABG eval but he is refusing to even come in or have the surgery so we canceled the appt here and I wanted to let you know.  Thanks, [employer]

## 2021-11-29 NOTE — Telephone Encounter (Signed)
Called pt per Dr. Vanetta Shawl note. VM full, unable to leave message.

## 2021-12-06 ENCOUNTER — Telehealth: Payer: Self-pay

## 2021-12-06 NOTE — Telephone Encounter (Signed)
Attempted to reach pt for follow up visit per Dr. Bing Matter. No Answer, No voicemail.

## 2022-02-25 ENCOUNTER — Other Ambulatory Visit: Payer: Self-pay | Admitting: Cardiology

## 2022-02-25 ENCOUNTER — Telehealth: Payer: Self-pay | Admitting: Cardiology

## 2022-02-25 NOTE — Telephone Encounter (Signed)
Needs refill of Nitro sent to [facility] on [address] in [place], patient has appt on 04/04/22  Please call [phone]   Thank you!

## 2022-02-28 NOTE — Telephone Encounter (Signed)
Pt aware that refill was sent to [facility].

## 2022-03-10 ENCOUNTER — Other Ambulatory Visit: Payer: Self-pay | Admitting: Cardiology

## 2022-04-04 ENCOUNTER — Ambulatory Visit: Payer: Medicare Other | Attending: Cardiology | Admitting: Cardiology

## 2022-04-04 ENCOUNTER — Encounter: Payer: Self-pay | Admitting: Cardiology

## 2022-04-04 VITALS — BP 100/58 | HR 86 | Ht 69.0 in | Wt 173.6 lb

## 2022-04-04 DIAGNOSIS — E1169 Type 2 diabetes mellitus with other specified complication: Secondary | ICD-10-CM

## 2022-04-04 DIAGNOSIS — E785 Hyperlipidemia, unspecified: Secondary | ICD-10-CM

## 2022-04-04 DIAGNOSIS — I5042 Chronic combined systolic (congestive) and diastolic (congestive) heart failure: Secondary | ICD-10-CM | POA: Diagnosis not present

## 2022-04-04 DIAGNOSIS — N182 Chronic kidney disease, stage 2 (mild): Secondary | ICD-10-CM | POA: Diagnosis not present

## 2022-04-04 DIAGNOSIS — I110 Hypertensive heart disease with heart failure: Secondary | ICD-10-CM | POA: Diagnosis not present

## 2022-04-04 DIAGNOSIS — I25118 Atherosclerotic heart disease of native coronary artery with other forms of angina pectoris: Secondary | ICD-10-CM

## 2022-04-04 NOTE — Progress Notes (Signed)
Cardiology Office Note:    Date:  04/04/2022   ID:  Frank Swanson, DOB 03/10/1946, MRN [id number]  PCP:  Patient, No Pcp Per  Cardiologist:  Norman Herrlich, MD    Referring MD: No ref. provider found   Medications reconciled not taking an ARB we will start him on low-dose Entresto. ASSESSMENT:    1. Coronary artery disease of native artery of native heart with stable angina pectoris (HCC)   2. Chronic combined systolic and diastolic heart failure (HCC)   3. Hypertensive heart disease with heart failure (HCC)   4. Chronic kidney disease, stage II (mild)   5. Hyperlipidemia associated with type 2 diabetes mellitus (HCC)    PLAN:    In order of problems listed above:  He has stable exertional angina we are both unsure what medicines he is and is not taking sister bring a list back we will attempt to reconcile and optimize his medications. Heart failure is compensated we will continue his current diuretic hopefully he is taking the beta-blocker and ARB I would not try to place him on Entresto with a borderline blood pressure. Stable CKD Await list of meds to see if he is taking a statin   Next appointment: 4 months   Medication Adjustments/Labs and Tests Ordered: Current medicines are reviewed at length with the patient today.  Concerns regarding medicines are outlined above.  No orders of the defined types were placed in this encounter.  No orders of the defined types were placed in this encounter.   Chief Complaint  Patient presents with   Follow-up   Coronary Artery Disease    History of Present Illness:    Frank Swanson is a 76 y.o. male with a hx of CAD with previous myocardial infarction heart failure EF previously 40 to 45% chronic kidney disease and dyslipidemia with statin intolerance was on multiple occasions declined an ischemia evaluation last seen by me 03/30/2021.  He underwent coronary angiography and was found to have severe left ventricular dysfunction  severe three-vessel coronary artery disease and declined surgical intervention.  He is very emphatic with me he will not consider bypass surgery Very concerned about compliance with medications he cannot tell me what he is and is not taking his sister is present she said she will go the home and write down a list of the medications or bring him back to our office He continues to take nitroglycerin and takes approximately 1 a day for exertional chest pain He is pleased with the quality of his life and is not having edema shortness of breath palpitation or syncope She bluntly asked me if he is a ticking time bomb and I told her it very severe heart disease and is in a high risk group  Diagnostic Studies/Procedures    Cath: 10/14/21       Ost LM to Mid LM lesion is 40% stenosed.   Prox LAD lesion is 90% stenosed.   Ost Cx to Prox Cx lesion is 80% stenosed.   1st Mrg lesion is 60% stenosed.   Mid Cx to Dist Cx lesion is 75% stenosed.   Prox RCA to Mid RCA lesion is 100% stenosed.   LV end diastolic pressure is normal.   Severe 3 vessel obstructive CAD Severely calcified coronary arteries. Low LV EDP 5 mm Hg   Plan: need CT surgery consultation for CABG.   Diagnostic Dominance: Right Echo: 10/14/21   IMPRESSIONS     1. Left ventricular ejection fraction, by estimation,  is 20 to 25%. The  left ventricle has severely decreased function. The left ventricle  demonstrates global hypokinesis. Left ventricular diastolic parameters are  consistent with Grade I diastolic  dysfunction (impaired relaxation).   2. Right ventricular systolic function is normal. The right ventricular  size is mildly enlarged.   3. A small pericardial effusion is present. The pericardial effusion is  circumferential.   4. The mitral valve is normal in structure. Mild to moderate mitral valve  regurgitation. No evidence of mitral stenosis.   5. The aortic valve is normal in structure. Aortic valve regurgitation  is  not visualized. No aortic stenosis is present.   6. Hypodense mass in the liver suspected to be a hemagioma- may benefit  from a dedicated ultrasound of the liver. The inferior vena cava is normal  in size with greater than 50% respiratory variability, suggesting right  atrial pressure of 3 mmHg.   Comparison(s): No prior Echocardiogram.   FINDINGS   Left Ventricle: Left ventricular ejection fraction, by estimation, is 20  to 25%. The left ventricle has severely decreased function. The left  ventricle demonstrates global hypokinesis. Definity contrast agent was  given IV to delineate the left  ventricular endocardial borders. The left ventricular internal cavity size  was normal in size. There is no left ventricular hypertrophy. Left  ventricular diastolic parameters are consistent with Grade I diastolic  dysfunction (impaired relaxation).   Right Ventricle: The right ventricular size is mildly enlarged. No  increase in right ventricular wall thickness. Right ventricular systolic  function is normal.   Left Atrium: Left atrial size was normal in size.   Right Atrium: Right atrial size was normal in size.   Pericardium: A small pericardial effusion is present. The pericardial  effusion is circumferential. Presence of epicardial fat layer.   Mitral Valve: The mitral valve is normal in structure. Mild to moderate  mitral valve regurgitation. No evidence of mitral valve stenosis.   Tricuspid Valve: The tricuspid valve is normal in structure. Tricuspid  valve regurgitation is trivial. No evidence of tricuspid stenosis.   Aortic Valve: The aortic valve is normal in structure. Aortic valve  regurgitation is not visualized. No aortic stenosis is present. Aortic  valve mean gradient measures 3.0 mmHg. Aortic valve peak gradient measures  4.7 mmHg. Aortic valve area, by VTI  measures 3.94 cm.   Pulmonic Valve: The pulmonic valve was normal in structure. Pulmonic valve   regurgitation is not visualized. No evidence of pulmonic stenosis.   Aorta: The aortic root is normal in size and structure.   Venous: Hypodense mass in the liver suspected to be a hemagioma- may  benefit from a dedicated ultrasound of the liver. The inferior vena cava  is normal in size with greater than 50% respiratory variability,  suggesting right atrial pressure of 3 mmHg.   IAS/Shunts: No atrial level shunt detected by color flow Doppler.  Compliance with diet, lifestyle and medications:Unsure Past Medical History:  Diagnosis Date   Cardiomyopathy (HCC) 05/06/2015   Chronic combined systolic and diastolic heart failure (HCC) 05/06/2015   Chronic kidney disease, stage II (mild) 05/06/2015   Chronic systolic heart failure (HCC) 05/06/2015   CKD (chronic kidney disease) 05/06/2015   High risk medication use 05/06/2015   Overview:  Digoxin   Hyperlipidemia 05/06/2015   Hypertensive heart disease with heart failure (HCC) 05/02/2017   Mild CAD 05/06/2015   Type 2 diabetes mellitus without complication, without long-term current use of insulin (HCC) 03/17/2017  Past Surgical History:  Procedure Laterality Date   CARDIAC CATHETERIZATION     LEFT HEART CATH AND CORONARY ANGIOGRAPHY N/A 10/14/2021   Procedure: LEFT HEART CATH AND CORONARY ANGIOGRAPHY;  Surgeon: Sw[place], Peter M, MD;  Location: [facility] INVASIVE CV LAB;  Service: Cardiovascular;  Laterality: N/A;    Current Medications: Current Meds  Medication Sig   aspirin EC 81 MG tablet Take 1 tablet (81 mg total) by mouth daily.   carvedilol (COREG) 6.25 MG tablet Take 1 tablet by mouth twice daily   furosemide (LASIX) 20 MG tablet Take 1 tablet by mouth once daily   glimepiride (AMARYL) 2 MG tablet Take 2 mg by mouth daily with breakfast.   nitroGLYCERIN (NITROSTAT) 0.4 MG SL tablet DISSOLVE ONE TABLET UNDER THE TONGUE EVERY 5 MINUTES AS NEEDED FOR CHEST PAIN.  DO NOT EXCEED A TOTAL OF 3 DOSES IN 15 MINUTES   omeprazole (PRILOSEC)  20 MG capsule Take 20 mg by mouth daily.   potassium chloride SA (KLOR-CON M) 20 MEQ tablet Take 1 tablet (20 mEq total) by mouth daily.   pravastatin (PRAVACHOL) 20 MG tablet Take 20 mg by mouth daily.   spironolactone (ALDACTONE) 25 MG tablet Take 1 tablet by mouth once daily     Allergies:   Patient has no known allergies.   Social History   Socioeconomic History   Marital status: Widowed    Spouse name: Not on file   Number of children: 0   Years of education: Not on file   Highest education level: 9th grade  Occupational History    Comment: Retired  Tobacco Use   Smoking status: Every Day    Packs/day: 1.00    Years: 65.00    Total pack years: 65.00    Types: Cigarettes   Smokeless tobacco: Never  Vaping Use   Vaping Use: Never used  Substance and Sexual Activity   Alcohol use: No   Drug use: No   Sexual activity: Not on file  Other Topics Concern   Not on file  Social History Narrative   Not on file   Social Determinants of Health   Financial Resource Strain: Low Risk  (10/15/2021)   Overall Financial Resource Strain (CARDIA)    Difficulty of Paying Living Expenses: Not very hard  Food Insecurity: No Food Insecurity (10/15/2021)   Hunger Vital Sign    Worried About Running Out of Food in the Last Year: Never true    Ran Out of Food in the Last Year: Never true  Transportation Needs: No Transportation Needs (10/15/2021)   PRAPARE - Administrator, Civil Service (Medical): No    Lack of Transportation (Non-Medical): No  Physical Activity: Not on file  Stress: Not on file  Social Connections: Not on file     Family History: The patient's family history includes Cancer in his father; Heart failure in his mother. ROS:   Please see the history of present illness.    All other systems reviewed and are negative.  EKGs/Labs/Other Studies Reviewed:    The following studies were reviewed today:   Recent Labs: 10/13/2021: ALT 15 10/15/2021: BUN 20;  Creatinine, Ser 1.23; Hemoglobin 12.4; Platelets 183; Potassium 3.8; Sodium 133  Recent Lipid Panel    Component Value Date/Time   CHOL 140 10/14/2021 0803   CHOL 156 03/30/2021 1011   TRIG 65 10/14/2021 0803   HDL 33 (L) 10/14/2021 0803   HDL 41 03/30/2021 1011   CHOLHDL 4.2 10/14/2021 0803  VLDL 13 10/14/2021 0803   LDLCALC 94 10/14/2021 0803   LDLCALC 81 03/30/2021 1011    Physical Exam:    VS:  BP (!) 100/58 (BP Location: Right Arm, Patient Position: Sitting, Cuff Size: Normal)   Pulse 86   Ht 5\' 9"  (1.753 m)   Wt 173 lb 9.6 oz (78.7 kg)   SpO2 97%   BMI 25.64 kg/m     Wt Readings from Last 3 Encounters:  04/04/22 173 lb 9.6 oz (78.7 kg)  10/14/21 178 lb 12.8 oz (81.1 kg)  10/13/21 179 lb (81.2 kg)     GEN:  Well nourished, well developed in no acute distress HEENT: Normal NECK: No JVD; No carotid bruits LYMPHATICS: No lymphadenopathy CARDIAC: RRR, no murmurs, rubs, gallops RESPIRATORY:  Clear to auscultation without rales, wheezing or rhonchi  ABDOMEN: Soft, non-tender, non-distended MUSCULOSKELETAL:  No edema; No deformity  SKIN: Warm and dry NEUROLOGIC:  Alert and oriented x 3 PSYCHIATRIC:  Normal affect    Signed, 10/15/21, MD  04/04/2022 2:21 PM    [facility]

## 2022-04-04 NOTE — Patient Instructions (Signed)
Medication Instructions:  Your physician recommends that you continue on your current medications as directed. Please refer to the Current Medication list given to you today.  *If you need a refill on your cardiac medications before your next appointment, please call your pharmacy*   Lab Work: None If you have labs (blood work) drawn today and your tests are completely normal, you will receive your results only by: MyChart Message (if you have MyChart) OR A paper copy in the mail If you have any lab test that is abnormal or we need to change your treatment, we will call you to review the results.   Testing/Procedures: None   Follow-Up: At [facility], you and your health needs are our priority.  As part of our continuing mission to provide you with exceptional heart care, we have created designated Provider Care Teams.  These Care Teams include your primary Cardiologist (physician) and Advanced Practice Providers (APPs -  Physician Assistants and Nurse Practitioners) who all work together to provide you with the care you need, when you need it.  We recommend signing up for the patient portal called "MyChart".  Sign up information is provided on this After Visit Summary.  MyChart is used to connect with patients for Virtual Visits (Telemedicine).  Patients are able to view lab/test results, encounter notes, upcoming appointments, etc.  Non-urgent messages can be sent to your provider as well.   To learn more about what you can do with MyChart, go to [email or web address].    Your next appointment:   4 month(s)  The format for your next appointment:   In Person  Provider:   Norman Herrlich, MD    Other Instructions Patients sister will bring in a list of the patient's current medications.  Important Information About Sugar

## 2022-04-05 ENCOUNTER — Telehealth: Payer: Self-pay

## 2022-04-05 NOTE — Telephone Encounter (Signed)
Patients sister returned with copy of patients current medication list. I have corrected list in chart and will forward to Dr Dulce Sellar for recommendations.

## 2022-05-03 ENCOUNTER — Other Ambulatory Visit: Payer: Self-pay

## 2022-05-03 ENCOUNTER — Telehealth: Payer: Self-pay | Admitting: Cardiology

## 2022-05-03 MED ORDER — NITROGLYCERIN 0.4 MG SL SUBL: SUBLINGUAL_TABLET | SUBLINGUAL | 6 refills | Status: DC

## 2022-05-03 NOTE — Telephone Encounter (Signed)
Follow Up:    Pharmacist called. She is very concerned about patient getting Nitroglycerin so frequent. She says he gets it just about every 2 weeks. He have gotten it 8 times, since 02-26-22. She wants to know what does she need to do?

## 2022-05-03 NOTE — Telephone Encounter (Signed)
Per Dr. Hulen Shouts reply- Nitroglycerin refill sent to [facility] pharmacy

## 2022-05-03 NOTE — Telephone Encounter (Signed)
Patient needs refill on Nitro sent to [facility] on [address]

## 2022-05-03 NOTE — Telephone Encounter (Signed)
Spoke with pharmacist. She is concerned that pt is getting Nitroglycerin so often she said he always says he doesn't know what he did with his medicine or he lost it. He has gotten Nitro 8 times since October. She said he has not had a statin filled at [facility] since December of last year. (Per your 04-04-22 note)

## 2022-05-04 ENCOUNTER — Other Ambulatory Visit: Payer: Self-pay

## 2022-05-04 MED ORDER — NITROGLYCERIN 0.4 MG SL SUBL: SUBLINGUAL_TABLET | SUBLINGUAL | 1 refills | Status: AC

## 2022-05-04 NOTE — Telephone Encounter (Signed)
Reviewed request with Dr Norman Herrlich, Ok to refill NTG sl #100 tablets x 1 additional refill

## 2022-06-16 DEATH — deceased

## 2022-08-11 ENCOUNTER — Ambulatory Visit: Payer: Medicare Other | Admitting: Cardiology

## 2023-01-19 IMAGING — DX DG CHEST 1V PORT
1 series · 1 of 1 positions shown · non-contrast
Comparison: Prior chest radiographs 02/29/2012 and earlier.

CLINICAL DATA: Chest pain.

EXAM:
PORTABLE CHEST 1 VIEW

[chest]
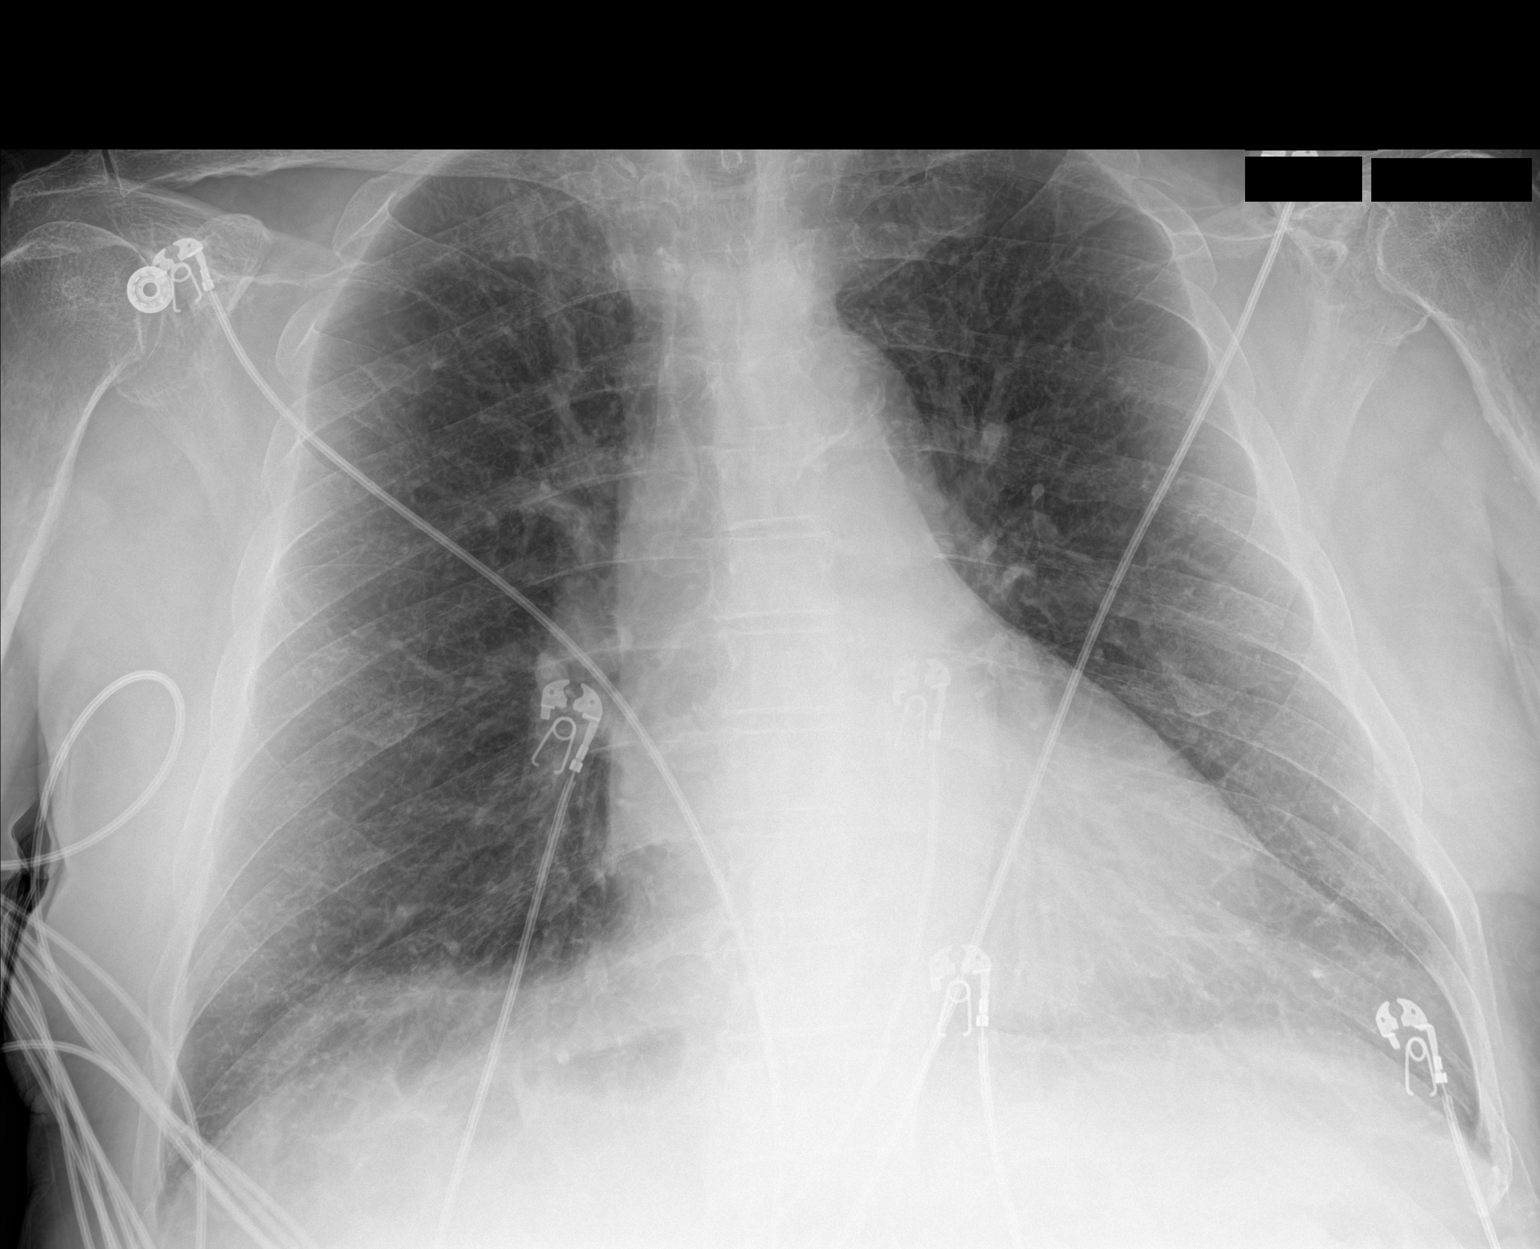

[1 of 1 positions shown; findings below may reference images not displayed]

FINDINGS: Heart size within normal limits. Aortic atherosclerosis. No
appreciable airspace consolidation or pulmonary edema. No evidence
of pleural effusion or pneumothorax. No acute bony abnormality
identified.
IMPRESSION: No evidence of acute cardiopulmonary abnormality.

Aortic Atherosclerosis ([id number]).
